# Patient Record
Sex: Female | Born: 2000 | Race: Black or African American | Hispanic: No | Marital: Single | Smoking: Never smoker
Health system: Southern US, Community
[De-identification: ages and names within clinical notes are randomized; demographics above are authoritative.]

---
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
## 2020-07-31 ENCOUNTER — Emergency Department (HOSPITAL_COMMUNITY): Payer: Self-pay

## 2020-07-31 ENCOUNTER — Emergency Department (HOSPITAL_COMMUNITY)
Admission: EM | Admit: 2020-07-31 | Discharge: 2020-07-31 | Disposition: A | Discharge status: Home / Self Care | Payer: Self-pay | Attending: Emergency Medicine | Admitting: Emergency Medicine

## 2020-07-31 ENCOUNTER — Other Ambulatory Visit: Payer: Self-pay

## 2020-07-31 DIAGNOSIS — K8020 Calculus of gallbladder without cholecystitis without obstruction: Secondary | ICD-10-CM | POA: Insufficient documentation

## 2020-07-31 DIAGNOSIS — R109 Unspecified abdominal pain: Secondary | ICD-10-CM

## 2020-07-31 DIAGNOSIS — Z7722 Contact with and (suspected) exposure to environmental tobacco smoke (acute) (chronic): Secondary | ICD-10-CM | POA: Insufficient documentation

## 2020-07-31 LAB — URINALYSIS, ROUTINE W REFLEX MICROSCOPIC
Bilirubin Urine: NEGATIVE
Glucose, UA: NEGATIVE mg/dL
Hgb urine dipstick: NEGATIVE
Ketones, ur: NEGATIVE mg/dL
Nitrite: NEGATIVE
Protein, ur: NEGATIVE mg/dL
Specific Gravity, Urine: 1.021 (ref 1.005–1.030)
pH: 7 (ref 5.0–8.0)

## 2020-07-31 LAB — I-STAT BETA HCG BLOOD, ED (MC, WL, AP ONLY): I-stat hCG, quantitative: <5 m[IU]/mL (ref <5)

## 2020-07-31 LAB — COMPREHENSIVE METABOLIC PANEL
ALT: 24 U/L (ref 0–44)
AST: 29 U/L (ref 15–41)
Albumin: 4 g/dL (ref 3.5–5.0)
Alkaline Phosphatase: 95 U/L (ref 38–126)
Anion gap: 9 (ref 5–15)
BUN: 5 mg/dL — ABNORMAL LOW (ref 6–20)
CO2: 24 mmol/L (ref 22–32)
Calcium: 9.5 mg/dL (ref 8.9–10.3)
Chloride: 103 mmol/L (ref 98–111)
Creatinine, Ser: 0.73 mg/dL (ref 0.44–1.00)
GFR calc Af Amer: >60 mL/min (ref >60)
GFR calc non Af Amer: >60 mL/min (ref >60)
Glucose, Bld: 100 mg/dL — ABNORMAL HIGH (ref 70–99)
Potassium: 4.3 mmol/L (ref 3.5–5.1)
Sodium: 136 mmol/L (ref 135–145)
Total Bilirubin: 0.9 mg/dL (ref 0.3–1.2)
Total Protein: 6.8 g/dL (ref 6.5–8.1)

## 2020-07-31 LAB — LIPASE, BLOOD: Lipase: 25 U/L (ref 11–51)

## 2020-07-31 MED ORDER — HYDROCODONE-ACETAMINOPHEN 5-325 MG PO TABS: 1.0000 | ORAL_TABLET | Freq: Four times a day (QID) | ORAL | 0 refills | Status: DC | PRN

## 2020-07-31 NOTE — ED Notes (Signed)
D/C paperwork reviewed w/ pt as well as follow-up care and pain management. Pt verbalized understanding. VSS, pt A&Ox4 and ambulatory w/ steady gait upon departure.

## 2020-07-31 NOTE — ED Notes (Signed)
Pt in no apparent distress at this time. Pt playing on phone and laughing w/ support person at this time.

## 2020-07-31 NOTE — Discharge Instructions (Addendum)
Work-up here today gallstone.  Which may be causing your symptoms.  No evidence of any kidney stones.  The labs that they were able to get showed no complicating factors related to the gallstone.  Your liver function tests were normal.  Recommend make an appointment to the wellness clinic for follow-up.  As well as calling [facility] regarding the gallstone.  They may very well want to get an ultrasound.  Or the wellness clinic can order the ultrasound to help confirm the gallstone.  Return for worse pain fevers persistent vomiting or any severe pain lasting 3 hours or longer.  This can be signs of a complication from the gallstone.  Would recommend low-fat diet this helps prevent further symptoms from the gallstone.

## 2020-07-31 NOTE — ED Triage Notes (Signed)
Patient complains of right sided flank pain that started approximately one and a half weeks ago, describe pain as feeling like a cramp. Denies changes in urination, denies any other complaints. Patient alert, oriented, and ambulatory.

## 2020-07-31 NOTE — ED Notes (Signed)
Attempted 2 IV sticks to place IV and obtain blood; both attempts unsuccessful.

## 2020-07-31 NOTE — ED Notes (Addendum)
Attempted IV x1, pt informed this RN that she did not want to be stuck again. Zackowski MD aware.

## 2020-07-31 NOTE — ED Provider Notes (Signed)
[facility] EMERGENCY DEPARTMENT Provider Note   CSN: [id number] Arrival date & time: 07/31/20  1237     History Chief Complaint  Patient presents with  . Flank Pain    Angelica Myers is a 19 y.o. female.  Patient with a complaint of right flank pain for about a week.  It waxes and wanes but has been there constantly.  Not associated with any vomiting not associated with any hematuria or urinary symptoms.  Patient states it kind of feels like a cramp when it gets intense.  Currently it is very minimal.  Pain is more on the upper part of the right flank.  Never had anything like this before.        No past medical history on file.  There are no problems to display for this patient.   No past surgical history on file.   OB History   No obstetric history on file.     No family history on file.  Social History   Tobacco Use  . Smoking status: Passive Smoke Exposure - Never Smoker  Substance Use Topics  . Alcohol use: No  . Drug use: Not on file    Home Medications Prior to Admission medications   Medication Sig Start Date End Date Taking? Authorizing Provider  albuterol (PROVENTIL HFA;VENTOLIN HFA) 108 (90 Base) MCG/ACT inhaler Inhale 2 puffs into the lungs every 6 (six) hours as needed. Patient not taking: Reported on 07/31/2020 04/02/16   Rebecka Apley, MD    Allergies    Patient has no known allergies.  Review of Systems   Review of Systems  Constitutional: Negative for chills and fever.  HENT: Negative for congestion, rhinorrhea and sore throat.   Eyes: Negative for visual disturbance.  Respiratory: Negative for cough and shortness of breath.   Cardiovascular: Negative for chest pain and leg swelling.  Gastrointestinal: Positive for abdominal pain. Negative for diarrhea, nausea and vomiting.  Genitourinary: Positive for flank pain. Negative for dysuria and hematuria.  Musculoskeletal: Negative for back pain and neck pain.   Skin: Negative for rash.  Neurological: Negative for dizziness, light-headedness and headaches.  Hematological: Does not bruise/bleed easily.  Psychiatric/Behavioral: Negative for confusion.    Physical Exam Updated Vital Signs BP 105/67 (BP Location: Right Arm)   Pulse 70   Temp 98.5 F (36.9 C) (Oral)   Resp 16   Ht 1.575 m (5\' 2" )   Wt 86.2 kg   LMP 07/13/2020 (Approximate)   SpO2 100%   BMI 34.75 kg/m   Physical Exam Vitals and nursing note reviewed.  Constitutional:      General: She is not in acute distress.    Appearance: Normal appearance. She is well-developed.  HENT:     Head: Normocephalic and atraumatic.  Eyes:     Extraocular Movements: Extraocular movements intact.     Conjunctiva/sclera: Conjunctivae normal.     Pupils: Pupils are equal, round, and reactive to light.  Cardiovascular:     Rate and Rhythm: Normal rate and regular rhythm.     Heart sounds: No murmur heard.   Pulmonary:     Effort: Pulmonary effort is normal. No respiratory distress.     Breath sounds: Normal breath sounds.  Abdominal:     Palpations: Abdomen is soft.     Tenderness: There is abdominal tenderness. There is no guarding.     Comments: Some  mild tenderness right upper quadrant no guarding  Musculoskeletal:  General: Normal range of motion.     Cervical back: Normal range of motion and neck supple.  Skin:    General: Skin is warm and dry.     Capillary Refill: Capillary refill takes less than 2 seconds.  Neurological:     General: No focal deficit present.     Mental Status: She is alert and oriented to person, place, and time.     Cranial Nerves: No cranial nerve deficit.     Sensory: No sensory deficit.     Motor: No weakness.     ED Results / Procedures / Treatments   Labs (all labs ordered are listed, but only abnormal results are displayed) Labs Reviewed  URINALYSIS, ROUTINE W REFLEX MICROSCOPIC - Abnormal; Notable for the following components:       Result Value   APPearance HAZY (*)    Leukocytes,Ua MODERATE (*)    Bacteria, UA RARE (*)    All other components within normal limits  COMPREHENSIVE METABOLIC PANEL - Abnormal; Notable for the following components:   Glucose, Bld 100 (*)    BUN 5 (*)    All other components within normal limits  LIPASE, BLOOD  CBC WITH DIFFERENTIAL/PLATELET  I-STAT BETA HCG BLOOD, ED ([facility], [facility], [facility] ONLY)    EKG None  Radiology CT Renal Stone Study  Result Date: 07/31/2020 CLINICAL DATA:  Right-sided flank pain EXAM: CT ABDOMEN AND PELVIS WITHOUT CONTRAST TECHNIQUE: Multidetector CT imaging of the abdomen and pelvis was performed following the standard protocol without IV contrast. COMPARISON:  None. FINDINGS: Lower chest: No acute abnormality. Hepatobiliary: Subcentimeter hypodensity within the anterior liver too small to further characterize. No biliary dilatation. Relative hypodensity in the fundus and neck of the gallbladder. No inflammatory changes at the right upper quadrant. Pancreas: Unremarkable. No pancreatic ductal dilatation or surrounding inflammatory changes. Spleen: Normal in size without focal abnormality. Adrenals/Urinary Tract: Adrenal glands are unremarkable. Kidneys are normal, without renal calculi, focal lesion, or hydronephrosis. Bladder is unremarkable. Stomach/Bowel: Stomach is within normal limits. Appendix appears normal. No evidence of bowel wall thickening, distention, or inflammatory changes. Vascular/Lymphatic: No significant vascular findings are present. No enlarged abdominal or pelvic lymph nodes. Reproductive: Uterus is unremarkable. 4.1 cm benign-appearing right adnexal cyst, no further imaging is required. Other: Negative for free air or free fluid. Musculoskeletal: No acute or significant osseous findings. IMPRESSION: 1. Negative for nephrolithiasis, hydronephrosis, or ureteral stone. 2. Possible noncalcified stones within the gallbladder. Correlation with ultrasound may be  obtained as clinically indicated. 3. 4.1 cm benign-appearing right adnexal cyst, no further imaging is required unless torsion is suspected clinically. Electronically Signed   By: Jasmine Pang M.D.   On: 07/31/2020 20:28    Procedures Procedures (including critical care time)  Medications Ordered in ED Medications - No data to display  ED Course  I have reviewed the triage vital signs and the nursing notes.  Pertinent labs & imaging results that were available during my care of the patient were reviewed by me and considered in my medical decision making (see chart for details).    MDM Rules/Calculators/A&P                           Patient work-up was significant for CT scan that showed no kidney stones would raise concerns about a gallbladder stone.  Patient CBC was hemolyzed but we were able to get a complete metabolic panel and lipase and all the liver function stuff was  normal.  Pregnancy test negative urinalysis not consistent with urinary tract infection may be some contamination.  Urine will be sent for culture just in case.  Patient will be given referral to wellness clinic since she has no primary care doctor.  Patient will be given a prescription for some pain medicine.  Patient also given a referral to general surgery.  Will need outpatient ultrasound either done by wellness clinic or perhaps to the general surgery clinic.  Patient without significant tenderness in the right upper quadrant.  And liver function test are normal no evidence of acute cholecystitis.       Final Clinical Impression(s) / ED Diagnoses Final diagnoses:  Right flank pain  Calculus of gallbladder without cholecystitis without obstruction    Rx / DC Orders ED Discharge Orders    None       Vanetta Mulders, MD 07/31/20 2125

## 2020-08-07 ENCOUNTER — Observation Stay
Admission: EM | Admit: 2020-08-07 | Discharge: 2020-08-08 | Disposition: A | Discharge status: Home / Self Care | Payer: Self-pay | Attending: Surgery | Admitting: Surgery

## 2020-08-07 ENCOUNTER — Encounter (HOSPITAL_COMMUNITY): Payer: Self-pay

## 2020-08-07 ENCOUNTER — Other Ambulatory Visit: Payer: Self-pay

## 2020-08-07 ENCOUNTER — Emergency Department: Payer: Self-pay

## 2020-08-07 ENCOUNTER — Emergency Department (HOSPITAL_COMMUNITY)
Admission: EM | Admit: 2020-08-07 | Discharge: 2020-08-07 | Disposition: A | Discharge status: Home / Self Care | Payer: Self-pay | Attending: Emergency Medicine | Admitting: Emergency Medicine

## 2020-08-07 DIAGNOSIS — K8020 Calculus of gallbladder without cholecystitis without obstruction: Secondary | ICD-10-CM

## 2020-08-07 DIAGNOSIS — R1013 Epigastric pain: Secondary | ICD-10-CM | POA: Insufficient documentation

## 2020-08-07 DIAGNOSIS — R112 Nausea with vomiting, unspecified: Secondary | ICD-10-CM | POA: Insufficient documentation

## 2020-08-07 DIAGNOSIS — Z20822 Contact with and (suspected) exposure to covid-19: Secondary | ICD-10-CM | POA: Insufficient documentation

## 2020-08-07 DIAGNOSIS — Z5321 Procedure and treatment not carried out due to patient leaving prior to being seen by health care provider: Secondary | ICD-10-CM | POA: Insufficient documentation

## 2020-08-07 DIAGNOSIS — K8000 Calculus of gallbladder with acute cholecystitis without obstruction: Principal | ICD-10-CM | POA: Insufficient documentation

## 2020-08-07 DIAGNOSIS — K819 Cholecystitis, unspecified: Secondary | ICD-10-CM | POA: Diagnosis present

## 2020-08-07 DIAGNOSIS — Z7722 Contact with and (suspected) exposure to environmental tobacco smoke (acute) (chronic): Secondary | ICD-10-CM | POA: Insufficient documentation

## 2020-08-07 DIAGNOSIS — R109 Unspecified abdominal pain: Secondary | ICD-10-CM

## 2020-08-07 LAB — COMPREHENSIVE METABOLIC PANEL
ALT: 19 U/L (ref 0–44)
ALT: 20 U/L (ref 0–44)
AST: 20 U/L (ref 15–41)
AST: 23 U/L (ref 15–41)
Albumin: 3.4 g/dL — ABNORMAL LOW (ref 3.5–5.0)
Albumin: 4 g/dL (ref 3.5–5.0)
Alkaline Phosphatase: 86 U/L (ref 38–126)
Alkaline Phosphatase: 85 U/L (ref 38–126)
Anion gap: 8 (ref 5–15)
Anion gap: 10 (ref 5–15)
BUN: 7 mg/dL (ref 6–20)
BUN: 6 mg/dL (ref 6–20)
CO2: 24 mmol/L (ref 22–32)
CO2: 25 mmol/L (ref 22–32)
Calcium: 9 mg/dL (ref 8.9–10.3)
Calcium: 9.1 mg/dL (ref 8.9–10.3)
Chloride: 104 mmol/L (ref 98–111)
Chloride: 104 mmol/L (ref 98–111)
Creatinine, Ser: 0.67 mg/dL (ref 0.44–1.00)
Creatinine, Ser: 0.51 mg/dL (ref 0.44–1.00)
GFR calc Af Amer: >60 mL/min (ref >60)
GFR calc Af Amer: >60 mL/min (ref >60)
GFR calc non Af Amer: >60 mL/min (ref >60)
GFR calc non Af Amer: >60 mL/min (ref >60)
Glucose, Bld: 104 mg/dL — ABNORMAL HIGH (ref 70–99)
Glucose, Bld: 91 mg/dL (ref 70–99)
Potassium: 3.7 mmol/L (ref 3.5–5.1)
Potassium: 3.5 mmol/L (ref 3.5–5.1)
Sodium: 136 mmol/L (ref 135–145)
Sodium: 139 mmol/L (ref 135–145)
Total Bilirubin: 0.6 mg/dL (ref 0.3–1.2)
Total Bilirubin: 0.9 mg/dL (ref 0.3–1.2)
Total Protein: 6.1 g/dL — ABNORMAL LOW (ref 6.5–8.1)
Total Protein: 7.2 g/dL (ref 6.5–8.1)

## 2020-08-07 LAB — CBC
HCT: 38 % (ref 36.0–46.0)
Hemoglobin: 12.6 g/dL (ref 12.0–15.0)
MCH: 31.4 pg (ref 26.0–34.0)
MCHC: 33.2 g/dL (ref 30.0–36.0)
MCV: 94.8 fL (ref 80.0–100.0)
Platelets: 266 10*3/uL (ref 150–400)
RBC: 4.01 MIL/uL (ref 3.87–5.11)
RDW: 11.9 % (ref 11.5–15.5)
WBC: 5.6 10*3/uL (ref 4.0–10.5)
nRBC: 0 % (ref 0.0–0.2)

## 2020-08-07 LAB — CBC WITH DIFFERENTIAL/PLATELET
Abs Immature Granulocytes: 0.02 10*3/uL (ref 0.00–0.07)
Basophils Absolute: 0 10*3/uL (ref 0.0–0.1)
Basophils Relative: 0 %
Eosinophils Absolute: 0 10*3/uL (ref 0.0–0.5)
Eosinophils Relative: 0 %
HCT: 39 % (ref 36.0–46.0)
Hemoglobin: 13.7 g/dL (ref 12.0–15.0)
Immature Granulocytes: 0 %
Lymphocytes Relative: 14 %
Lymphs Abs: 1.1 10*3/uL (ref 0.7–4.0)
MCH: 32.4 pg (ref 26.0–34.0)
MCHC: 35.1 g/dL (ref 30.0–36.0)
MCV: 92.2 fL (ref 80.0–100.0)
Monocytes Absolute: 0.4 10*3/uL (ref 0.1–1.0)
Monocytes Relative: 5 %
Neutro Abs: 6.5 10*3/uL (ref 1.7–7.7)
Neutrophils Relative %: 81 %
Platelets: 270 10*3/uL (ref 150–400)
RBC: 4.23 MIL/uL (ref 3.87–5.11)
RDW: 11.9 % (ref 11.5–15.5)
WBC: 8.1 10*3/uL (ref 4.0–10.5)
nRBC: 0 % (ref 0.0–0.2)

## 2020-08-07 LAB — TYPE AND SCREEN
ABO/RH(D): O NEG
Antibody Screen: NEGATIVE

## 2020-08-07 LAB — LIPASE, BLOOD
Lipase: 24 U/L (ref 11–51)
Lipase: 27 U/L (ref 11–51)

## 2020-08-07 LAB — I-STAT BETA HCG BLOOD, ED (MC, WL, AP ONLY): I-stat hCG, quantitative: <5 m[IU]/mL (ref <5)

## 2020-08-07 LAB — SARS CORONAVIRUS 2 BY RT PCR (HOSPITAL ORDER, PERFORMED IN ~~LOC~~ HOSPITAL LAB): SARS Coronavirus 2: NEGATIVE

## 2020-08-07 LAB — PROTIME-INR
INR: 1 (ref 0.8–1.2)
Prothrombin Time: 13.2 seconds (ref 11.4–15.2)

## 2020-08-07 MED ORDER — ONDANSETRON 4 MG PO TBDP: 4.0000 mg | ORAL_TABLET | Freq: Four times a day (QID) | ORAL | Status: DC | PRN

## 2020-08-07 MED ORDER — OXYCODONE HCL 5 MG PO TABS
5.0000 mg | ORAL_TABLET | ORAL | Status: DC | PRN
  Administered 2020-08-08: 5 mg via ORAL
  Filled 2020-08-07: qty 1

## 2020-08-07 MED ORDER — ACETAMINOPHEN 500 MG PO TABS
1000.0000 mg | ORAL_TABLET | Freq: Four times a day (QID) | ORAL | Status: DC
  Administered 2020-08-07 – 2020-08-08 (×2): 1000 mg via ORAL
  Filled 2020-08-07 (×2): qty 2

## 2020-08-07 MED ORDER — PANTOPRAZOLE SODIUM 40 MG IV SOLR
40.0000 mg | Freq: Every day | INTRAVENOUS | Status: DC
  Administered 2020-08-07: 40 mg via INTRAVENOUS
  Filled 2020-08-07: qty 40

## 2020-08-07 MED ORDER — KETOROLAC TROMETHAMINE 30 MG/ML IJ SOLN: 30.0000 mg | Freq: Four times a day (QID) | INTRAMUSCULAR | Status: DC | PRN

## 2020-08-07 MED ORDER — MORPHINE SULFATE (PF) 2 MG/ML IV SOLN
2.0000 mg | INTRAVENOUS | Status: DC | PRN
  Administered 2020-08-08: 2 mg via INTRAVENOUS
  Filled 2020-08-07: qty 1

## 2020-08-07 MED ORDER — ONDANSETRON 4 MG PO TBDP
8.0000 mg | ORAL_TABLET | Freq: Once | ORAL | Status: AC
  Administered 2020-08-07: 8 mg via ORAL
  Filled 2020-08-07: qty 2

## 2020-08-07 MED ORDER — SODIUM CHLORIDE 0.9 % IV BOLUS
1000.0000 mL | Freq: Once | INTRAVENOUS | Status: AC
  Administered 2020-08-07: 1000 mL via INTRAVENOUS

## 2020-08-07 MED ORDER — DIPHENHYDRAMINE HCL 12.5 MG/5ML PO ELIX
12.5000 mg | ORAL_SOLUTION | Freq: Four times a day (QID) | ORAL | Status: DC | PRN
  Filled 2020-08-07: qty 5

## 2020-08-07 MED ORDER — HEPARIN SODIUM (PORCINE) 5000 UNIT/ML IJ SOLN
5000.0000 [IU] | Freq: Three times a day (TID) | INTRAMUSCULAR | Status: DC
  Filled 2020-08-07: qty 1

## 2020-08-07 MED ORDER — DIPHENHYDRAMINE HCL 50 MG/ML IJ SOLN: 12.5000 mg | Freq: Four times a day (QID) | INTRAMUSCULAR | Status: DC | PRN

## 2020-08-07 MED ORDER — SODIUM CHLORIDE 0.9 % IV SOLN
2.0000 g | INTRAVENOUS | Status: DC
  Administered 2020-08-07: 2 g via INTRAVENOUS
  Filled 2020-08-07 (×3): qty 20

## 2020-08-07 MED ORDER — ONDANSETRON HCL 4 MG/2ML IJ SOLN
4.0000 mg | Freq: Four times a day (QID) | INTRAMUSCULAR | Status: DC | PRN
  Administered 2020-08-08: 4 mg via INTRAVENOUS

## 2020-08-07 MED ORDER — SODIUM CHLORIDE 0.9 % IV SOLN: INTRAVENOUS | Status: DC

## 2020-08-07 MED ORDER — LACTATED RINGERS IV SOLN: INTRAVENOUS | Status: DC

## 2020-08-07 MED ORDER — KETOROLAC TROMETHAMINE 10 MG PO TABS
10.0000 mg | ORAL_TABLET | Freq: Once | ORAL | Status: AC
  Administered 2020-08-07: 10 mg via ORAL
  Filled 2020-08-07: qty 1

## 2020-08-07 MED ORDER — HYDROMORPHONE HCL 1 MG/ML IJ SOLN
0.5000 mg | Freq: Once | INTRAMUSCULAR | Status: AC
  Administered 2020-08-07: 0.5 mg via INTRAVENOUS
  Filled 2020-08-07: qty 1

## 2020-08-07 NOTE — ED Notes (Signed)
Pt called x3 for vital recheck no answer 

## 2020-08-07 NOTE — ED Notes (Signed)
Spoke with marsha on floor, continue to await bed arrival.

## 2020-08-07 NOTE — ED Notes (Signed)
No labs needed at this time. Drawn this am at [facility].

## 2020-08-07 NOTE — ED Triage Notes (Signed)
Patient complains of recurrent epigastric pain with nausea and vomiting since being seen last week and diagnosed with gallstones. Pain started again after eating steak last night

## 2020-08-07 NOTE — ED Notes (Signed)
Waiting on hospital bed to be available to transport pt to floor, currently per floor no bed in admission room and awaiting bed delivery from [facility].

## 2020-08-07 NOTE — ED Notes (Signed)
Pt had PIV in L AC, painful when flushing, pt been stuck multiple times between [facility] and [facility] ED, pt having surgery tomorrow.

## 2020-08-07 NOTE — ED Notes (Signed)
See triage note, pt reports recently being discharged from hospital with dx with gallstones, prescription for pain meds and referral to general surgery, has appt with general surgery in near future.  Reports vomiting that started today, unknown amount, denies fevers, increased pain to RUQ.  Was seen at [facility] today and LWBS, labs drawn.  Ambulatory to treatment room, alert and oriented, clear speech.

## 2020-08-07 NOTE — Progress Notes (Signed)
D/w Dr. Scotty Court in detail. Pt impacted stone clinically c/w acute cholecystitis,  no evidence of biliary obstruction. We will admit, IV A/Bs, add her to the OR schedule tomorrow .

## 2020-08-07 NOTE — ED Provider Notes (Addendum)
[facility] Emergency Department Provider Note  ____________________________________________  Time seen: Approximately 8:03 PM  I have reviewed the triage vital signs and the nursing notes.   HISTORY  Chief Complaint Flank Pain    HPI Angelica Myers is a 19 y.o. female with known cholelithiasis who comes to the ED today complaining of persistent right upper quadrant abdominal pain radiating to the back and vomiting. Pain is severe, aching. No aggravating or alleviating factors. She has not tried to eat or drink anything today due to the vomiting. Denies diarrhea or constipation, no fevers chills chest pain or shortness of breath.  Initially had labs done at [facility], but left without being seen from the ED and then came to the [facility] regional later when symptoms persisted.         There are no problems to display for this patient.    History reviewed. No pertinent surgical history.   Prior to Admission medications   Medication Sig Start Date End Date Taking? Authorizing Provider  albuterol (PROVENTIL HFA;VENTOLIN HFA) 108 (90 Base) MCG/ACT inhaler Inhale 2 puffs into the lungs every 6 (six) hours as needed. Patient not taking: Reported on 07/31/2020 04/02/16   Rebecka Apley, MD  HYDROcodone-acetaminophen (NORCO/VICODIN) 5-325 MG tablet Take 1 tablet by mouth every 6 (six) hours as needed for moderate pain. 07/31/20   Vanetta Mulders, MD     Allergies Patient has no known allergies.   History reviewed. No pertinent family history.  Social History Social History   Tobacco Use  . Smoking status: Passive Smoke Exposure - Never Smoker  Substance Use Topics  . Alcohol use: No  . Drug use: Not on file    Review of Systems  Constitutional:   No fever or chills.  ENT:   No sore throat. No rhinorrhea. Cardiovascular:   No chest pain or syncope. Respiratory:   No dyspnea or cough. Gastrointestinal: Positive for abdominal pain and  vomiting Musculoskeletal:   Negative for focal pain or swelling All other systems reviewed and are negative except as documented above in ROS and HPI.  ____________________________________________   PHYSICAL EXAM:  VITAL SIGNS: ED Triage Vitals  Enc Vitals Group     BP 08/07/20 1233 118/78     Pulse Rate 08/07/20 1232 79     Resp 08/07/20 1232 18     Temp 08/07/20 1232 98.4 F (36.9 C)     Temp Source 08/07/20 1232 Oral     SpO2 08/07/20 1232 97 %     Weight 08/07/20 1232 198 lb 6.6 oz (90 kg)     Height 08/07/20 1232 5\' 2"  (1.575 m)     Head Circumference --      Peak Flow --      Pain Score 08/07/20 1232 8     Pain Loc --      Pain Edu? --      Excl. in GC? --     Vital signs reviewed, nursing assessments reviewed.   Constitutional:   Alert and oriented. Non-toxic appearance. Eyes:   Conjunctivae are normal. EOMI. PERRL. ENT      Head:   Normocephalic and atraumatic.      Nose:   Wearing a mask.      Mouth/Throat:   Wearing a mask.      Neck:   No meningismus. Full ROM. Hematological/Lymphatic/Immunilogical:   No cervical lymphadenopathy. Cardiovascular:   RRR. Symmetric bilateral radial and DP pulses.  No murmurs. Cap refill less than  2 seconds. Respiratory:   Normal respiratory effort without tachypnea/retractions. Breath sounds are clear and equal bilaterally. No wheezes/rales/rhonchi. Gastrointestinal:   Soft with right upper quadrant tenderness and positive Murphy sign. Non distended. There is no CVA tenderness.  No rebound, rigidity, or guarding.  Musculoskeletal:   Normal range of motion in all extremities. No joint effusions.  No lower extremity tenderness.  No edema. Neurologic:   Normal speech and language.  Motor grossly intact. No acute focal neurologic deficits are appreciated.  Skin:    Skin is warm, dry and intact. No rash noted.  No petechiae, purpura, or bullae.  ____________________________________________    LABS (pertinent  positives/negatives) (all labs ordered are listed, but only abnormal results are displayed) Labs Reviewed  SARS CORONAVIRUS 2 BY RT PCR (HOSPITAL ORDER, PERFORMED IN [facility])  COMPREHENSIVE METABOLIC PANEL  LIPASE, BLOOD  CBC WITH DIFFERENTIAL/PLATELET  PROTIME-INR  TYPE AND SCREEN   ____________________________________________   EKG    ____________________________________________    RADIOLOGY  US ABDOMEN LIMITED RUQ  Result Date: 08/07/2020 CLINICAL DATA:  Right upper quadrant abdominal pain EXAM: ULTRASOUND ABDOMEN LIMITED RIGHT UPPER QUADRANT COMPARISON:  CT 07/31/2020 FINDINGS: Gallbladder: Hypodensities noted within the gallbladder lumen on a prior CT examination correspond to 2 shadowing gallstones of the current examination. One stone appears impacted within the gallbladder neck. However, the gallbladder is not distended, there is no significant gallbladder wall thickening, and no pericholecystic fluid is identified. The sonographic Eulah Pont sign is reportedly negative. Common bile duct: Diameter: 3 mm in proximal diameter Liver: No focal lesion identified. Within normal limits in parenchymal echogenicity. Portal vein is patent on color Doppler imaging with normal direction of blood flow towards the liver. Other: No ascites IMPRESSION: Cholelithiasis with stone impacted within the gallbladder neck. No superimposed sonographic features of acute cholecystitis. Electronically Signed   By: Helyn Numbers MD   On: 08/07/2020 20:00    ____________________________________________   PROCEDURES Angiocath insertion  Date/Time: 08/07/2020 9:12 PM Performed by: Sharman Cheek, MD Authorized by: Sharman Cheek, MD  Preparation: Patient was prepped and draped in the usual sterile fashion. Local anesthesia used: no  Anesthesia: Local anesthesia used: no  Sedation: Patient sedated: no  Patient tolerance: patient tolerated the procedure well with no immediate  complications Comments: [place] vis, 1 attempt, EBL 0. 20gauge R upper arm     ____________________________________________  DIFFERENTIAL DIAGNOSIS   Cholecystitis, choledocholithiasis, impacted gallstone, biliary colic, constipation  CLINICAL IMPRESSION / ASSESSMENT AND PLAN / ED COURSE  Medications ordered in the ED: Medications  lactated ringers infusion (has no administration in time range)  HYDROmorphone (DILAUDID) injection 0.5 mg (has no administration in time range)  ondansetron (ZOFRAN-ODT) disintegrating tablet 8 mg (8 mg Oral Given 08/07/20 1914)  ketorolac (TORADOL) tablet 10 mg (10 mg Oral Given 08/07/20 1913)    Pertinent labs & imaging results that were available during my care of the patient were reviewed by me and considered in my medical decision making (see chart for details).  Angelica Myers was evaluated in Emergency Department on 08/07/2020 for the symptoms described in the history of present illness. She was evaluated in the context of the global COVID-19 pandemic, which necessitated consideration that the patient might be at risk for infection with the SARS-CoV-2 virus that causes COVID-19. Institutional protocols and algorithms that pertain to the evaluation of patients at risk for COVID-19 are in a state of rapid change based on information released by regulatory bodies including the CDC and federal  and state organizations. These policies and algorithms were followed during the patient's care in the ED.   Patient presents with persistent abdominal pain and vomiting throughout the day. Vital signs are normal, labs from [facility] earlier today reviewed which are unremarkable. Patient given oral Toradol and Zofran while obtaining an ultrasound which shows unfortunately an impacted 1.2 cm gallstone in the gallbladder neck. With this finding and her persistent symptoms, I contacted Dr. Everlene Farrier who will plan cholecystectomy tomorrow. Patient is agreeable to this.  We will  send a new set of labs, Covid screening, give IV Dilaudid 0.5 mg for additional pain relief. IV fluid at maintenance rate. N.p.o. after midnight. Clears in the meantime.      ____________________________________________   FINAL CLINICAL IMPRESSION(S) / ED DIAGNOSES    Final diagnoses:  Impacted gallstone of gallbladder  Intractable abdominal pain     ED Discharge Orders    None      Portions of this note were generated with dragon dictation software. Dictation errors may occur despite best attempts at proofreading.   Sharman Cheek, MD 08/07/20 Malvin Johns    Sharman Cheek, MD 08/07/20 2113

## 2020-08-07 NOTE — ED Notes (Signed)
Last call for pt. No response.  

## 2020-08-07 NOTE — ED Notes (Signed)
Pt up to restroom to void.  

## 2020-08-07 NOTE — ED Triage Notes (Signed)
Pt states recent gallstone dx and told to come back if starting vomiting. Pt seen as [place] today but LWBS. Blood was drawn this morning.

## 2020-08-07 NOTE — ED Notes (Signed)
US at bedside

## 2020-08-08 ENCOUNTER — Encounter: Payer: Self-pay | Admitting: Surgery

## 2020-08-08 ENCOUNTER — Encounter
Admission: EM | Disposition: A | Discharge status: Home / Self Care | Payer: Self-pay | Source: Home / Self Care | Attending: Emergency Medicine

## 2020-08-08 ENCOUNTER — Observation Stay: Payer: Self-pay | Admitting: Anesthesiology

## 2020-08-08 DIAGNOSIS — K819 Cholecystitis, unspecified: Secondary | ICD-10-CM

## 2020-08-08 LAB — HIV ANTIBODY (ROUTINE TESTING W REFLEX): HIV Screen 4th Generation wRfx: NONREACTIVE

## 2020-08-08 SURGERY — CHOLECYSTECTOMY, ROBOT-ASSISTED, LAPAROSCOPIC
Anesthesia: General

## 2020-08-08 MED ORDER — SODIUM CHLORIDE 0.9 % IV SOLN
2.0000 g | Freq: Once | INTRAVENOUS | Status: AC
  Administered 2020-08-08: 2 g via INTRAVENOUS
  Filled 2020-08-08: qty 20

## 2020-08-08 MED ORDER — LIDOCAINE HCL (CARDIAC) PF 100 MG/5ML IV SOSY
PREFILLED_SYRINGE | INTRAVENOUS | Status: DC | PRN
  Administered 2020-08-08: 80 mg via INTRAVENOUS

## 2020-08-08 MED ORDER — HYDROCODONE-ACETAMINOPHEN 5-325 MG PO TABS: 1.0000 | ORAL_TABLET | ORAL | 0 refills | Status: DC | PRN

## 2020-08-08 MED ORDER — PROPOFOL 10 MG/ML IV BOLUS
INTRAVENOUS | Status: AC
  Filled 2020-08-08: qty 20

## 2020-08-08 MED ORDER — PHENYLEPHRINE HCL (PRESSORS) 10 MG/ML IV SOLN
INTRAVENOUS | Status: DC | PRN
  Administered 2020-08-08: 100 ug via INTRAVENOUS
  Administered 2020-08-08: 200 ug via INTRAVENOUS
  Administered 2020-08-08: 100 ug via INTRAVENOUS

## 2020-08-08 MED ORDER — ROCURONIUM BROMIDE 100 MG/10ML IV SOLN
INTRAVENOUS | Status: DC | PRN
  Administered 2020-08-08: 10 mg via INTRAVENOUS
  Administered 2020-08-08: 50 mg via INTRAVENOUS

## 2020-08-08 MED ORDER — ACETAMINOPHEN 10 MG/ML IV SOLN
INTRAVENOUS | Status: AC
  Filled 2020-08-08: qty 100

## 2020-08-08 MED ORDER — SCOPOLAMINE 1 MG/3DAYS TD PT72
1.0000 | MEDICATED_PATCH | TRANSDERMAL | Status: DC
  Administered 2020-08-08: 1.5 mg via TRANSDERMAL

## 2020-08-08 MED ORDER — KETOROLAC TROMETHAMINE 30 MG/ML IJ SOLN
30.0000 mg | Freq: Four times a day (QID) | INTRAMUSCULAR | Status: DC
  Administered 2020-08-08: 30 mg via INTRAVENOUS
  Filled 2020-08-08: qty 1

## 2020-08-08 MED ORDER — MIDAZOLAM HCL 2 MG/2ML IJ SOLN
INTRAMUSCULAR | Status: AC
  Filled 2020-08-08: qty 2

## 2020-08-08 MED ORDER — FENTANYL CITRATE (PF) 100 MCG/2ML IJ SOLN
25.0000 ug | INTRAMUSCULAR | Status: DC | PRN
  Administered 2020-08-08: 50 ug via INTRAVENOUS

## 2020-08-08 MED ORDER — PROPOFOL 10 MG/ML IV BOLUS
INTRAVENOUS | Status: DC | PRN
  Administered 2020-08-08: 180 mg via INTRAVENOUS

## 2020-08-08 MED ORDER — MIDAZOLAM HCL 2 MG/2ML IJ SOLN
INTRAMUSCULAR | Status: DC | PRN
  Administered 2020-08-08: 2 mg via INTRAVENOUS

## 2020-08-08 MED ORDER — FENTANYL CITRATE (PF) 100 MCG/2ML IJ SOLN
INTRAMUSCULAR | Status: DC
Start: 2020-08-08 — End: 2020-08-08
  Filled 2020-08-08: qty 2

## 2020-08-08 MED ORDER — DEXAMETHASONE SODIUM PHOSPHATE 10 MG/ML IJ SOLN
INTRAMUSCULAR | Status: DC | PRN
  Administered 2020-08-08: 10 mg via INTRAVENOUS

## 2020-08-08 MED ORDER — SEVOFLURANE IN SOLN
RESPIRATORY_TRACT | Status: AC
  Filled 2020-08-08: qty 250

## 2020-08-08 MED ORDER — PROMETHAZINE HCL 25 MG/ML IJ SOLN: 6.2500 mg | INTRAMUSCULAR | Status: DC | PRN

## 2020-08-08 MED ORDER — EPINEPHRINE PF 1 MG/ML IJ SOLN
INTRAMUSCULAR | Status: AC
  Filled 2020-08-08: qty 1

## 2020-08-08 MED ORDER — SUGAMMADEX SODIUM 500 MG/5ML IV SOLN
INTRAVENOUS | Status: DC | PRN
  Administered 2020-08-08: 180 mg via INTRAVENOUS

## 2020-08-08 MED ORDER — FENTANYL CITRATE (PF) 100 MCG/2ML IJ SOLN
INTRAMUSCULAR | Status: DC | PRN
  Administered 2020-08-08: 100 ug via INTRAVENOUS
  Administered 2020-08-08 (×2): 50 ug via INTRAVENOUS

## 2020-08-08 MED ORDER — FENTANYL CITRATE (PF) 100 MCG/2ML IJ SOLN
INTRAMUSCULAR | Status: AC
  Filled 2020-08-08: qty 2

## 2020-08-08 MED ORDER — LACTATED RINGERS IV SOLN: INTRAVENOUS | Status: DC | PRN

## 2020-08-08 MED ORDER — INDOCYANINE GREEN 25 MG IV SOLR
5.0000 mg | Freq: Once | INTRAVENOUS | Status: AC
  Administered 2020-08-08: 5 mg via INTRAVENOUS
  Filled 2020-08-08: qty 2

## 2020-08-08 MED ORDER — BUPIVACAINE-EPINEPHRINE (PF) 0.25% -1:200000 IJ SOLN
INTRAMUSCULAR | Status: DC | PRN
  Administered 2020-08-08: 30 mL

## 2020-08-08 MED ORDER — OXYCODONE HCL 5 MG PO TABS: 5.0000 mg | ORAL_TABLET | Freq: Once | ORAL | Status: DC | PRN

## 2020-08-08 MED ORDER — EPHEDRINE SULFATE 50 MG/ML IJ SOLN
INTRAMUSCULAR | Status: DC | PRN
  Administered 2020-08-08: 10 mg via INTRAVENOUS

## 2020-08-08 MED ORDER — OXYCODONE HCL 5 MG/5ML PO SOLN: 5.0000 mg | Freq: Once | ORAL | Status: DC | PRN

## 2020-08-08 MED ORDER — BUPIVACAINE HCL (PF) 0.25 % IJ SOLN
INTRAMUSCULAR | Status: AC
  Filled 2020-08-08: qty 30

## 2020-08-08 MED ORDER — ACETAMINOPHEN 10 MG/ML IV SOLN
INTRAVENOUS | Status: DC | PRN
  Administered 2020-08-08: 1000 mg via INTRAVENOUS

## 2020-08-08 MED ORDER — SUCCINYLCHOLINE CHLORIDE 20 MG/ML IJ SOLN
INTRAMUSCULAR | Status: DC | PRN
  Administered 2020-08-08: 100 mg via INTRAVENOUS

## 2020-08-08 MED ORDER — SCOPOLAMINE 1 MG/3DAYS TD PT72
MEDICATED_PATCH | TRANSDERMAL | Status: AC
  Filled 2020-08-08: qty 1

## 2020-08-08 SURGICAL SUPPLY — 48 items
CANISTER SUCT 1200ML W/VALVE (MISCELLANEOUS) ×4 IMPLANT
CANNULA REDUC XI 12-8 STAPL (CANNULA) ×1
CANNULA REDUC XI 12-8MM STAPL (CANNULA) ×1
CANNULA REDUCER 12-8 DVNC XI (CANNULA) ×2 IMPLANT
CHLORAPREP W/TINT 26 (MISCELLANEOUS) ×4 IMPLANT
CLIP VESOLOCK MED LG 6/CT (CLIP) ×8 IMPLANT
COVER WAND RF STERILE (DRAPES) ×4 IMPLANT
DECANTER SPIKE VIAL GLASS SM (MISCELLANEOUS) ×4 IMPLANT
DEFOGGER SCOPE WARMER CLEARIFY (MISCELLANEOUS) ×4 IMPLANT
DERMABOND ADVANCED (GAUZE/BANDAGES/DRESSINGS) ×2
DERMABOND ADVANCED .7 DNX12 (GAUZE/BANDAGES/DRESSINGS) ×2 IMPLANT
DRAPE ARM DVNC X/XI (DISPOSABLE) ×8 IMPLANT
DRAPE COLUMN DVNC XI (DISPOSABLE) ×2 IMPLANT
DRAPE DA VINCI XI ARM (DISPOSABLE) ×8
DRAPE DA VINCI XI COLUMN (DISPOSABLE) ×2
ELECT CAUTERY BLADE 6.4 (BLADE) ×4 IMPLANT
ELECT REM PT RETURN 9FT ADLT (ELECTROSURGICAL) ×4
ELECTRODE REM PT RTRN 9FT ADLT (ELECTROSURGICAL) ×2 IMPLANT
GLOVE BIO SURGEON STRL SZ7 (GLOVE) ×8 IMPLANT
GOWN STRL REUS W/ TWL LRG LVL3 (GOWN DISPOSABLE) ×8 IMPLANT
GOWN STRL REUS W/TWL LRG LVL3 (GOWN DISPOSABLE) ×8
IRRIGATION STRYKERFLOW (MISCELLANEOUS) IMPLANT
IRRIGATOR STRYKERFLOW (MISCELLANEOUS)
IV NS 1000ML (IV SOLUTION)
IV NS 1000ML BAXH (IV SOLUTION) IMPLANT
KIT PINK PAD W/HEAD ARE REST (MISCELLANEOUS) ×4
KIT PINK PAD W/HEAD ARM REST (MISCELLANEOUS) ×2 IMPLANT
LABEL OR SOLS (LABEL) ×4 IMPLANT
NEEDLE HYPO 22GX1.5 SAFETY (NEEDLE) ×4 IMPLANT
NS IRRIG 500ML POUR BTL (IV SOLUTION) ×4 IMPLANT
OBTURATOR OPTICAL STANDARD 8MM (TROCAR) ×2
OBTURATOR OPTICAL STND 8 DVNC (TROCAR) ×2
OBTURATOR OPTICALSTD 8 DVNC (TROCAR) ×2 IMPLANT
PACK LAP CHOLECYSTECTOMY (MISCELLANEOUS) ×4 IMPLANT
PENCIL ELECTRO HAND CTR (MISCELLANEOUS) ×4 IMPLANT
POUCH SPECIMEN RETRIEVAL 10MM (ENDOMECHANICALS) ×4 IMPLANT
SEAL CANN UNIV 5-8 DVNC XI (MISCELLANEOUS) ×6 IMPLANT
SEAL XI 5MM-8MM UNIVERSAL (MISCELLANEOUS) ×6
SET TUBE SMOKE EVAC HIGH FLOW (TUBING) ×4 IMPLANT
SOLUTION ELECTROLUBE (MISCELLANEOUS) ×4 IMPLANT
SPONGE LAP 18X18 RF (DISPOSABLE) ×4 IMPLANT
SPONGE LAP 4X18 RFD (DISPOSABLE) IMPLANT
STAPLER CANNULA SEAL DVNC XI (STAPLE) ×2 IMPLANT
STAPLER CANNULA SEAL XI (STAPLE) ×2
SUT MNCRL AB 4-0 PS2 18 (SUTURE) ×4 IMPLANT
SUT VICRYL 0 AB UR-6 (SUTURE) ×8 IMPLANT
TAPE TRANSPORE STRL 2 31045 (GAUZE/BANDAGES/DRESSINGS) ×4 IMPLANT
TROCAR BALLN GELPORT 12X130M (ENDOMECHANICALS) ×4 IMPLANT

## 2020-08-08 NOTE — Anesthesia Preprocedure Evaluation (Addendum)
Anesthesia Evaluation  Patient identified by MRN, date of birth, ID band Patient awake    Reviewed: Allergy & Precautions, H&P , NPO status , Patient's Chart, lab work & pertinent test results  History of Anesthesia Complications Negative for: history of anesthetic complications  Airway Mallampati: II  TM Distance: >3 FB     Dental  (+) Teeth Intact   Pulmonary neg pulmonary ROS, neg sleep apnea, neg COPD,    breath sounds clear to auscultation       Cardiovascular (-) angina(-) Past MI and (-) Cardiac Stents negative cardio ROS  (-) dysrhythmias  Rhythm:regular Rate:Normal     Neuro/Psych negative neurological ROS  negative psych ROS   GI/Hepatic negative GI ROS, Neg liver ROS,   Endo/Other  negative endocrine ROS  Renal/GU      Musculoskeletal   Abdominal   Peds  Hematology negative hematology ROS (+)   Anesthesia Other Findings History reviewed. No pertinent past medical history.  History reviewed. No pertinent surgical history.  BMI    Body Mass Index: 36.29 kg/m      Reproductive/Obstetrics negative OB ROS                            Anesthesia Physical Anesthesia Plan  ASA: I  Anesthesia Plan: General ETT   Post-op Pain Management:    Induction:   PONV Risk Score and Plan: Ondansetron, Dexamethasone, Midazolam and Treatment may vary due to age or medical condition  Airway Management Planned:   Additional Equipment:   Intra-op Plan:   Post-operative Plan:   Informed Consent: I have reviewed the patients History and Physical, chart, labs and discussed the procedure including the risks, benefits and alternatives for the proposed anesthesia with the patient or authorized representative who has indicated his/her understanding and acceptance.     Dental Advisory Given  Plan Discussed with: Anesthesiologist, CRNA and Surgeon  Anesthesia Plan Comments:          Anesthesia Quick Evaluation

## 2020-08-08 NOTE — Plan of Care (Signed)
Patient discharged to home with family.  Patient RX given and discharge instructions given.  Patient verbalized understanding.  Patient PIVx2 removed tip intact.  Patient states pain well controlled. Ambulated independently to w/c for transport to lobby.

## 2020-08-08 NOTE — Anesthesia Procedure Notes (Signed)
Procedure Name: Intubation Date/Time: 08/08/2020 9:57 AM Performed by: Nelda Marseille, CRNA Pre-anesthesia Checklist: Patient identified, Patient being monitored, Timeout performed, Emergency Drugs available and Suction available Patient Re-evaluated:Patient Re-evaluated prior to induction Oxygen Delivery Method: Circle system utilized Preoxygenation: Pre-oxygenation with 100% oxygen Induction Type: IV induction Ventilation: Mask ventilation without difficulty Laryngoscope Size: Mac, 3 and McGraph Grade View: Grade I Tube type: Oral Tube size: 7.0 mm Number of attempts: 1 Airway Equipment and Method: Stylet and Video-laryngoscopy Placement Confirmation: ETT inserted through vocal cords under direct vision,  positive ETCO2 and breath sounds checked- equal and bilateral Secured at: 21 cm Tube secured with: Tape Dental Injury: Teeth and Oropharynx as per pre-operative assessment

## 2020-08-08 NOTE — Discharge Instructions (Signed)

## 2020-08-08 NOTE — Plan of Care (Signed)
Patient received from surgery alert and oriented. Patient ambulated to restroom.  Pain controlled effectively with as needed medications. Family at bedside.

## 2020-08-08 NOTE — Transfer of Care (Signed)
Immediate Anesthesia Transfer of Care Note  Patient: Angelica Myers  Procedure(s) Performed: XI ROBOTIC ASSISTED LAPAROSCOPIC CHOLECYSTECTOMY (N/A ) INDOCYANINE GREEN FLUORESCENCE IMAGING (ICG)  Patient Location: PACU  Anesthesia Type:General  Level of Consciousness: awake, alert  and sedated  Airway & Oxygen Therapy: Patient Spontanous Breathing and Patient connected to face mask oxygen  Post-op Assessment: Report given to RN and Post -op Vital signs reviewed and stable  Post vital signs: Reviewed and stable  Last Vitals:  Vitals Value Taken Time  BP 106/58 08/08/20 1110  Temp 35.9 C 08/08/20 1110  Pulse 82 08/08/20 1114  Resp 11 08/08/20 1114  SpO2 100 % 08/08/20 1114  Vitals shown include unvalidated device data.  Last Pain:  Vitals:   08/08/20 0821  TempSrc:   PainSc: 0-No pain         Complications: No complications documented.

## 2020-08-08 NOTE — Op Note (Signed)
Robotic assisted laparoscopic Cholecystectomy  Pre-operative Diagnosis:Acute cholecystitis  Post-operative Diagnosis: same  Procedure:  Robotic assisted laparoscopic Cholecystectomy  Surgeon: Sterling Big, MD FACS  Anesthesia: Gen. with endotracheal tube  Findings: Acute Cholecystitis   Estimated Blood Loss: 10 cc       Specimens: Gallbladder           Complications: none   Procedure Details  The patient was seen again in the Holding Room. The benefits, complications, treatment options, and expected outcomes were discussed with the patient. The risks of bleeding, infection, recurrence of symptoms, failure to resolve symptoms, bile duct damage, bile duct leak, retained common bile duct stone, bowel injury, any of which could require further surgery and/or ERCP, stent, or papillotomy were reviewed with the patient. The likelihood of improving the patient's symptoms with return to their baseline status is good.  The patient and/or family concurred with the proposed plan, giving informed consent.  The patient was taken to Operating Room, identified  and the procedure verified as Laparoscopic Cholecystectomy.  A Time Out was held and the above information confirmed.  Prior to the induction of general anesthesia, antibiotic prophylaxis was administered. VTE prophylaxis was in place. General endotracheal anesthesia was then administered and tolerated well. After the induction, the abdomen was prepped with Chloraprep and draped in the sterile fashion. The patient was positioned in the supine position.  Cut down technique was used to enter the abdominal cavity and a Hasson trochar was placed after two vicryl stitches were anchored to the fascia. Pneumoperitoneum was then created with CO2 and tolerated well without any adverse changes in the patient's vital signs.  Three 8-mm ports were placed under direct vision. All skin incisions  were infiltrated with a local anesthetic agent before making the  incision and placing the trocars.   The patient was positioned  in reverse Trendelenburg, robot was brought to the surgical field and docked in the standard fashion.  We made sure all the instrumentation was kept indirect view at all times and that there were no collision between the arms. I scrubbed out and went to the console.  The gallbladder was identified, the fundus grasped and retracted cephalad. Adhesions were lysed bluntly. The infundibulum was grasped and retracted laterally, exposing the peritoneum overlying the triangle of Calot. This was then divided and exposed in a blunt fashion. An extended critical view of the cystic duct and cystic artery was obtained.  The cystic duct was clearly identified and bluntly dissected.   Artery and duct were double clipped and divided. Using ICG cholangiography we visualize the cystic duct and so no a Baron biliary ductal anatomy or evidence of bile injuries. The gallbladder was taken from the gallbladder fossa in a retrograde fashion with the electrocautery.  Hemostasis was achieved with the electrocautery. nspection of the right upper quadrant was performed. No bleeding, bile duct injury or leak, or bowel injury was noted. Robotic instruments and robotic arms were undocked in the standard fashion.  I scrubbed back in.  The gallbladder was removed and placed in an Endocatch bag.   Pneumoperitoneum was released.  The periumbilical port site was closed with interrumpted 0 Vicryl sutures. 4-0 subcuticular Monocryl was used to close the skin. Dermabond was  applied.  The patient was then extubated and brought to the recovery room in stable condition. Sponge, lap, and needle counts were correct at closure and at the conclusion of the case.  Caroleen Hamman, MD, FACS

## 2020-08-08 NOTE — H&P (Signed)
[facility] SURGICAL HISTORY & PHYSICAL (cpt [phone])  HISTORY OF PRESENT ILLNESS (HPI):  19 y.o. female presented to [facility] ED yesterday (08/22) evening for abdominal pain. Patient reported a history of around 1 week of intermittent epigastric abdominal pain. This has been intermittent in nature however after dinner the night prior to presentation this has become more constant and severe. She describes this as a aching and crampy pain which radiates to her back. She endorses associated nausea and emesis with the pain. Nothing makes this better and eating seems to exacerbate the pain. No fever, chills, cough, CP, SOB, urinary changes, bowel changes, or juandice. She does have a known history of cholelithiasis. No previous abdominal surgeries. Work up in the ED was concerning for cholelithiasis and clinical concern for acute cholecystitis.   General surgery is consulted by emergency medicine physician Dr Sharman Cheek, MD for evaluation and management of acute cholecystitis.   PAST MEDICAL HISTORY (PMH):  History reviewed. No pertinent past medical history.  Reviewed. Otherwise negative.   PAST SURGICAL HISTORY (PSH):  History reviewed. No pertinent surgical history.  Reviewed. Otherwise negative.   MEDICATIONS:  Prior to Admission medications   Medication Sig Start Date End Date Taking? Authorizing Provider  HYDROcodone-acetaminophen (NORCO/VICODIN) 5-325 MG tablet Take 1 tablet by mouth every 6 (six) hours as needed for moderate pain. 07/31/20  Yes Vanetta Mulders, MD     ALLERGIES:  No Known Allergies   SOCIAL HISTORY:  Social History   Socioeconomic History  . Marital status: Single    Spouse name: Not on file  . Number of children: Not on file  . Years of education: Not on file  . Highest education level: Not on file  Occupational History  . Not on file  Tobacco Use  . Smoking status: Passive Smoke Exposure - Never Smoker  . Smokeless tobacco: Never Used   Vaping Use  . Vaping Use: Never used  Substance and Sexual Activity  . Alcohol use: No  . Drug use: Not on file  . Sexual activity: Not on file  Other Topics Concern  . Not on file  Social History Narrative  . Not on file   Social Determinants of Health   Financial Resource Strain:   . Difficulty of Paying Living Expenses: Not on file  Food Insecurity:   . Worried About Programme researcher, broadcasting/film/video in the Last Year: Not on file  . Ran Out of Food in the Last Year: Not on file  Transportation Needs:   . Lack of Transportation (Medical): Not on file  . Lack of Transportation (Non-Medical): Not on file  Physical Activity:   . Days of Exercise per Week: Not on file  . Minutes of Exercise per Session: Not on file  Stress:   . Feeling of Stress : Not on file  Social Connections:   . Frequency of Communication with Friends and Family: Not on file  . Frequency of Social Gatherings with Friends and Family: Not on file  . Attends Religious Services: Not on file  . Active Member of Clubs or Organizations: Not on file  . Attends Banker Meetings: Not on file  . Marital Status: Not on file  Intimate Partner Violence:   . Fear of Current or Ex-Partner: Not on file  . Emotionally Abused: Not on file  . Physically Abused: Not on file  . Sexually Abused: Not on file     FAMILY HISTORY:  History reviewed. No pertinent family history.  Otherwise negative.   REVIEW OF SYSTEMS:  Review of Systems  Constitutional: Negative for chills and fever.  HENT: Negative for congestion and sore throat.   Respiratory: Negative for cough and shortness of breath.   Cardiovascular: Negative for chest pain and palpitations.  Gastrointestinal: Positive for abdominal pain, nausea and vomiting. Negative for blood in stool, constipation and diarrhea.  Genitourinary: Negative for dysuria and urgency.  All other systems reviewed and are negative.   VITAL SIGNS:  Temp:  [98 F (36.7 C)-98.4 F  (36.9 C)] 98.3 F (36.8 C) (08/23 0027) Pulse Rate:  [65-85] 72 (08/23 0027) Resp:  [16-20] 20 (08/23 0027) BP: (107-118)/(63-93) 115/80 (08/23 0027) SpO2:  [97 %-100 %] 100 % (08/23 0027) Weight:  [90 kg-90.7 kg] 90 kg (08/22 1232)     Height: 5\' 2"  (157.5 cm) Weight: 90 kg BMI (Calculated): 36.28   PHYSICAL EXAM:  Physical Exam Vitals and nursing note reviewed. Exam conducted with a chaperone present.  Constitutional:      General: She is not in acute distress.    Appearance: Normal appearance. She is obese. She is not ill-appearing.  HENT:     Head: Normocephalic and atraumatic.  Eyes:     General: No scleral icterus.    Conjunctiva/sclera: Conjunctivae normal.     Pupils: Pupils are equal, round, and reactive to light.  Cardiovascular:     Rate and Rhythm: Normal rate and regular rhythm.     Pulses: Normal pulses.     Heart sounds: No murmur heard.   Pulmonary:     Effort: Pulmonary effort is normal. No respiratory distress.     Breath sounds: Normal breath sounds. No wheezing.  Abdominal:     General: There is no distension.     Palpations: Abdomen is soft.     Tenderness: There is abdominal tenderness in the right upper quadrant. There is no guarding or rebound. Negative signs include Murphy's sign.     Hernia: No hernia is present.  Genitourinary:    Comments: Deferred Musculoskeletal:     Right lower leg: No edema.     Left lower leg: No edema.  Skin:    General: Skin is warm and dry.     Coloration: Skin is not jaundiced or pale.     Findings: No erythema.  Neurological:     General: No focal deficit present.     Mental Status: She is alert and oriented to person, place, and time.  Psychiatric:        Mood and Affect: Mood normal.        Behavior: Behavior normal.     INTAKE/OUTPUT:  This shift: Total I/O In: 1522.7 [I.V.:422.7; IV Piggyback:1100] Out: -   Last 2 shifts: @IOLAST2SHIFTS @  Labs:  CBC Latest Ref Rng & Units 08/07/2020 08/07/2020  WBC  4.0 - 10.5 K/uL 8.1 5.6  Hemoglobin 12.0 - 15.0 g/dL 08/09/2020 08/09/2020  Hematocrit 36 - 46 % 39.0 38.0  Platelets 150 - 400 K/uL 270 266   CMP Latest Ref Rng & Units 08/07/2020 08/07/2020 07/31/2020  Glucose 70 - 99 mg/dL 91 08/09/2020) 08/02/2020)  BUN 6 - 20 mg/dL 6 7 5(L)  Creatinine 366(Y - 1.00 mg/dL 403(K 7.42 5.95  Sodium 135 - 145 mmol/L 139 136 136  Potassium 3.5 - 5.1 mmol/L 3.5 3.7 4.3  Chloride 98 - 111 mmol/L 104 104 103  CO2 22 - 32 mmol/L 25 24 24   Calcium 8.9 - 10.3 mg/dL 9.1 9.0 9.5  Total  Protein 6.5 - 8.1 g/dL 7.2 6.1(L) 6.8  Total Bilirubin 0.3 - 1.2 mg/dL 0.9 0.6 0.9  Alkaline Phos 38 - 126 U/L 85 86 95  AST 15 - 41 U/L 23 20 29   ALT 0 - 44 U/L 20 19 24      Imaging studies:   RUQ (08/07/2020) concerning for cholelithiasis with stone in GB neck without gross sonographic evidence of cholecystitis, and radiologist report reviewed:  IMPRESSION: Cholelithiasis with stone impacted within the gallbladder neck. No superimposed sonographic features of acute cholecystitis.   Assessment/Plan: (ICD-10's: K81.0) 19 y.o. female with cholelithiasis and persistent RUQ abdominal pain concerning for acute cholecystitis   - Admit to general surgery  - Plan for robotic assisted laparoscopic cholecystectomy today with Dr 08/09/2020 pending OR/Anesthesia availability    - All risks, benefits, and alternatives to above procedure(s) were discussed with the patient, all of her questions were answered to her expressed satisfaction, patient expresses she wishes to proceed, and informed consent was obtained.  - NPO + IVF resuscitation  - IV Abx (Rocephin)  - pain control prn; antiemetics prn  - Monitor abdominal examination  - DVT prophylaxis; Hold for OR  All of the above findings and recommendations were discussed with the patient, and all of her questions were answered to her expressed satisfaction.  -- 36, PA-C [facility] 08/08/2020, 6:50 AM [phone] M-F: 7am -  4pm

## 2020-08-08 NOTE — Discharge Summary (Signed)
  Patient ID: PHILANA YOUNIS MRN: [id number] DOB/AGE: [birth year] 21 y.o.  Admit date: 08/07/2020 Discharge date: 08/08/2020   Discharge Diagnoses:  Active Problems:   Cholecystitis   Procedures:lap cholecystecomy  Hospital Course:   admitted with findings consistent with acute choecystitis and  was taken promptly to the operating room for an uneventful laparoscopic robotic cholecystectomy . At  The time of discharge the patient was ambulating,  pain was controlled.  Her vital signs were stable and she was afebrile.   physical exam at discharge showed a pt  in no acute distress.  Awake and alert.  Abdomen: Soft incisions healing well without infection or peritonitis.  Extremities well-perfused and no edema.  Condition of the patient the time of discharge was stable     Disposition: Discharge disposition: 01-Home or Self Care       Discharge Instructions    Call MD for:  difficulty breathing, headache or visual disturbances   Complete by: As directed    Call MD for:  extreme fatigue   Complete by: As directed    Call MD for:  hives   Complete by: As directed    Call MD for:  persistant dizziness or light-headedness   Complete by: As directed    Call MD for:  persistant nausea and vomiting   Complete by: As directed    Call MD for:  redness, tenderness, or signs of infection (pain, swelling, redness, odor or green/yellow discharge around incision site)   Complete by: As directed    Call MD for:  severe uncontrolled pain   Complete by: As directed    Call MD for:  temperature >100.4   Complete by: As directed    Diet - low sodium heart healthy   Complete by: As directed    Discharge instructions   Complete by: As directed    Shower 48hrs may apply ice packs and may use OTC iburofen or aleeve   Increase activity slowly   Complete by: As directed    Lifting restrictions   Complete by: As directed    20 lbs x 6 wks     Allergies as of 08/08/2020   No Known  Allergies     Medication List    TAKE these medications   HYDROcodone-acetaminophen 5-325 MG tablet Commonly known as: NORCO/VICODIN Take 1-2 tablets by mouth every 4 (four) hours as needed for moderate pain. What changed:   how much to take  when to take this       Follow-up Information    Andrianna Manalang, [place] F, MD Follow up in 2 week(s).   Specialty: General Surgery Why: virtual Contact information: [address] [place] [address] [phone]                Sterling Big, MD FACS

## 2020-08-09 LAB — SURGICAL PATHOLOGY

## 2020-08-09 NOTE — Anesthesia Postprocedure Evaluation (Signed)
Anesthesia Post Note  Patient: [name]  Procedure(s) Performed: XI ROBOTIC ASSISTED LAPAROSCOPIC CHOLECYSTECTOMY (N/A ) INDOCYANINE GREEN FLUORESCENCE IMAGING (ICG)  Patient location during evaluation: PACU Anesthesia Type: General Level of consciousness: awake and alert Pain management: pain level controlled Vital Signs Assessment: post-procedure vital signs reviewed and stable Respiratory status: spontaneous breathing, nonlabored ventilation and respiratory function stable Cardiovascular status: blood pressure returned to baseline and stable Postop Assessment: no apparent nausea or vomiting Anesthetic complications: no   No complications documented.   Last Vitals:  Vitals:   08/08/20 1243 08/08/20 1413  BP: 111/64 108/74  Pulse: 85 67  Resp: 18   Temp:  36.5 C  SpO2: 99% 100%    Last Pain:  Vitals:   08/08/20 1737  TempSrc:   PainSc: 2                  Karleen Hampshire

## 2022-01-24 ENCOUNTER — Encounter (HOSPITAL_COMMUNITY): Payer: Self-pay | Admitting: Emergency Medicine

## 2022-01-24 ENCOUNTER — Ambulatory Visit (HOSPITAL_COMMUNITY)
Admission: EM | Admit: 2022-01-24 | Discharge: 2022-01-24 | Disposition: A | Discharge status: Home / Self Care | Payer: 59 | Attending: Emergency Medicine | Admitting: Emergency Medicine

## 2022-01-24 ENCOUNTER — Other Ambulatory Visit: Payer: Self-pay

## 2022-01-24 DIAGNOSIS — B9689 Other specified bacterial agents as the cause of diseases classified elsewhere: Secondary | ICD-10-CM | POA: Diagnosis not present

## 2022-01-24 DIAGNOSIS — J019 Acute sinusitis, unspecified: Secondary | ICD-10-CM | POA: Diagnosis not present

## 2022-01-24 DIAGNOSIS — J111 Influenza due to unidentified influenza virus with other respiratory manifestations: Secondary | ICD-10-CM

## 2022-01-24 MED ORDER — ONDANSETRON 4 MG PO TBDP: 4.0000 mg | ORAL_TABLET | Freq: Three times a day (TID) | ORAL | 0 refills | Status: DC | PRN

## 2022-01-24 MED ORDER — AMOXICILLIN-POT CLAVULANATE 875-125 MG PO TABS: 1.0000 | ORAL_TABLET | Freq: Two times a day (BID) | ORAL | 0 refills | Status: DC

## 2022-01-24 NOTE — ED Triage Notes (Signed)
Pt reports sore throat, chills, body aches, cough and emesis x 1 week. States have been taking OTC medication with no relief.

## 2022-01-24 NOTE — Discharge Instructions (Signed)
Use saline nasal spray several times a day to help your congestion drain. Keep taking Mucinex. Focus on staying hydrated with clear liquids for now until your stomach is feeling better/ vomiting has stopped.   Use Imodium for diarrhea as directed on the package. Use the prescription nausea medicine if needed.

## 2022-01-25 NOTE — ED Provider Notes (Signed)
[facility]    CSN: [id number] Arrival date & time: 01/24/22  0900      History   Chief Complaint Chief Complaint  Patient presents with   URI    HPI Angelica Myers is a 21 y.o. female. Pt reports sore throat, chills, body aches, cough and emesis x 1 week. States have been taking OTC medication with no relief.  Reports last week she was tested for COVID, flu, and strep at another facility and all tests were negative.  She felt she was getting better, but now she feels she is getting worse again.  She reports green thick nasal discharge.   URI Presenting symptoms: congestion, cough and sore throat   Presenting symptoms: no fever    History reviewed. No pertinent past medical history.  Patient Active Problem List   Diagnosis Date Noted   Cholecystitis 08/07/2020    History reviewed. No pertinent surgical history.  OB History   No obstetric history on file.      Home Medications    Prior to Admission medications   Medication Sig Start Date End Date Taking? Authorizing Provider  amoxicillin-clavulanate (AUGMENTIN) 875-125 MG tablet Take 1 tablet by mouth every 12 (twelve) hours. 01/24/22  Yes Carvel Getting, NP  ondansetron (ZOFRAN-ODT) 4 MG disintegrating tablet Take 1 tablet (4 mg total) by mouth every 8 (eight) hours as needed for nausea or vomiting. 01/24/22  Yes Carvel Getting, NP  HYDROcodone-acetaminophen (NORCO/VICODIN) 5-325 MG tablet Take 1-2 tablets by mouth every 4 (four) hours as needed for moderate pain. 08/08/20   Jules Husbands, MD    Family History Family History  Problem Relation Age of Onset   Healthy Mother    Healthy Father     Social History Social History   Tobacco Use   Smoking status: Never    Passive exposure: Yes   Smokeless tobacco: Never  Vaping Use   Vaping Use: Never used  Substance Use Topics   Alcohol use: No     Allergies   Patient has no known allergies.   Review of Systems Review of Systems   Constitutional:  Negative for chills and fever.  HENT:  Positive for congestion, postnasal drip, sinus pressure and sore throat.   Respiratory:  Positive for cough.   Gastrointestinal:  Positive for diarrhea, nausea and vomiting. Negative for abdominal pain.    Physical Exam Triage Vital Signs ED Triage Vitals  Enc Vitals Group     BP 01/24/22 0936 107/71     Pulse Rate 01/24/22 0936 86     Resp 01/24/22 0936 16     Temp 01/24/22 0936 97.6 F (36.4 C)     Temp Source 01/24/22 0936 Oral     SpO2 01/24/22 0936 99 %     Weight 01/24/22 0935 198 lb 6.6 oz (90 kg)     Height 01/24/22 0935 5\' 2"  (1.575 m)     Head Circumference --      Peak Flow --      Pain Score 01/24/22 0934 5     Pain Loc --      Pain Edu? --      Excl. in [place]? --    No data found.  Updated Vital Signs BP 107/71 (BP Location: Left Arm)    Pulse 86    Temp 97.6 F (36.4 C) (Oral)    Resp 16    Ht 5\' 2"  (1.575 m)    Wt 198 lb 6.6 oz (  90 kg)    SpO2 99%    BMI 36.29 kg/m   Visual Acuity Right Eye Distance:   Left Eye Distance:   Bilateral Distance:    Right Eye Near:   Left Eye Near:    Bilateral Near:     Physical Exam Constitutional:      General: She is not in acute distress.    Appearance: Normal appearance. She is not ill-appearing.  HENT:     Right Ear: Tympanic membrane, ear canal and external ear normal.     Left Ear: Tympanic membrane, ear canal and external ear normal.     Nose:     Right Sinus: No maxillary sinus tenderness or frontal sinus tenderness.     Left Sinus: Frontal sinus tenderness present. No maxillary sinus tenderness.     Mouth/Throat:     Mouth: Mucous membranes are moist.     Pharynx: Oropharynx is clear.  Cardiovascular:     Rate and Rhythm: Normal rate and regular rhythm.  Pulmonary:     Effort: Pulmonary effort is normal.     Breath sounds: Normal breath sounds.  Abdominal:     General: Abdomen is flat. Bowel sounds are normal.     Tenderness: There is no  abdominal tenderness. There is no guarding or rebound.  Neurological:     Mental Status: She is alert.     UC Treatments / Results  Labs (all labs ordered are listed, but only abnormal results are displayed) Labs Reviewed - No data to display  EKG   Radiology No results found.  Procedures Procedures (including critical care time)  Medications Ordered in UC Medications - No data to display  Initial Impression / Assessment and Plan / UC Course  I have reviewed the triage vital signs and the nursing notes.  Pertinent labs & imaging results that were available during my care of the patient were reviewed by me and considered in my medical decision making (see chart for details).    Given duration of symptoms and onset of purulent nasal drainage, I suspect she has a bacterial sinus infection after her viral infection.  Discussed supportive care measures, prescribed Augmentin, and prescribed Zofran.  Final Clinical Impressions(s) / UC Diagnoses   Final diagnoses:  Influenza-like illness  Acute bacterial sinusitis     Discharge Instructions      Use saline nasal spray several times a day to help your congestion drain. Keep taking Mucinex. Focus on staying hydrated with clear liquids for now until your stomach is feeling better/ vomiting has stopped.   Use Imodium for diarrhea as directed on the package. Use the prescription nausea medicine if needed.    ED Prescriptions     Medication Sig Dispense Auth. Provider   ondansetron (ZOFRAN-ODT) 4 MG disintegrating tablet Take 1 tablet (4 mg total) by mouth every 8 (eight) hours as needed for nausea or vomiting. 20 tablet Carvel Getting, NP   amoxicillin-clavulanate (AUGMENTIN) 875-125 MG tablet Take 1 tablet by mouth every 12 (twelve) hours. 14 tablet Carvel Getting, NP      PDMP not reviewed this encounter.   Carvel Getting, NP 01/25/22 [phone]

## 2022-03-12 ENCOUNTER — Ambulatory Visit (HOSPITAL_COMMUNITY)
Admission: EM | Admit: 2022-03-12 | Discharge: 2022-03-12 | Disposition: A | Discharge status: Home / Self Care | Payer: 59 | Attending: Sports Medicine | Admitting: Sports Medicine

## 2022-03-12 ENCOUNTER — Encounter (HOSPITAL_COMMUNITY): Payer: Self-pay

## 2022-03-12 DIAGNOSIS — R052 Subacute cough: Secondary | ICD-10-CM | POA: Diagnosis not present

## 2022-03-12 DIAGNOSIS — K219 Gastro-esophageal reflux disease without esophagitis: Secondary | ICD-10-CM

## 2022-03-12 DIAGNOSIS — R067 Sneezing: Secondary | ICD-10-CM

## 2022-03-12 MED ORDER — FAMOTIDINE 20 MG PO TABS: 20.0000 mg | ORAL_TABLET | Freq: Two times a day (BID) | ORAL | 0 refills | Status: DC

## 2022-03-12 MED ORDER — FLUTICASONE PROPIONATE 50 MCG/ACT NA SUSP: 1.0000 | Freq: Every day | NASAL | 0 refills | Status: DC

## 2022-03-12 NOTE — ED Triage Notes (Signed)
Pt presents with cough x 3-4 weeks ?

## 2022-03-12 NOTE — ED Provider Notes (Signed)
?[facility] ? ? ? ?CSN: [id number] ?Arrival date & time: 03/12/22  [id number] ? ? ?  ? ?History   ?Chief Complaint ?Chief Complaint  ?Patient presents with  ? Cough  ? ? ?HPI ?Angelica Myers is a 21 y.o. female with cough x 3-4 weeks. ? ? ?Cough ?Associated symptoms: shortness of breath (intermittent)   ?Associated symptoms: no chest pain, no chills, no fever, no headaches, no rash and no wheezing   ? ?Having cough for 3-4 weeks. ?Treated for sinus infection on 02/14/22 and had mild improvement in cough but complete resolution of other symptoms (congestion, nasal, etc.) ?All symptoms have improved other than the cough ?Cough is mostly dry, sometimes has some green mucous or phlegm  ?No fever/chills ? ?Reports some shortness of breath when trying to take deep breath ?No history of asthma; denies any wheezing ?No chest pain ?Right side hurts when she coughs of lateral rib cage  ? ?Some sneezing ?Denies history of season allergies  ?Cough worse in morning ?Does have unusual taste in mouth AM, metallic-like  ?Does have burning like symptom in chest/throat at times ? ?History reviewed. No pertinent past medical history. ? ?Patient Active Problem List  ? Diagnosis Date Noted  ? Cholecystitis 08/07/2020  ? ? ?History reviewed. No pertinent surgical history. ? ?OB History   ?No obstetric history on file. ?  ? ? ? ?Home Medications   ? ?Prior to Admission medications   ?Medication Sig Start Date End Date Taking? Authorizing Provider  ?famotidine (PEPCID) 20 MG tablet Take 1 tablet (20 mg total) by mouth 2 (two) times daily. 03/12/22 04/11/22 Yes Madelyn BrunnerBrooks, Shireen Rayburn, DO  ?fluticasone (FLONASE) 50 MCG/ACT nasal spray Place 1 spray into both nostrils daily. 03/12/22 04/11/22 Yes Madelyn BrunnerBrooks, Janise Gora, DO  ?amoxicillin-clavulanate (AUGMENTIN) 875-125 MG tablet Take 1 tablet by mouth every 12 (twelve) hours. ?Patient not taking: Reported on 03/12/2022 01/24/22   Cathlyn ParsonsKabbe, Angela M, NP  ?HYDROcodone-acetaminophen (NORCO/VICODIN) 5-325 MG tablet Take  1-2 tablets by mouth every 4 (four) hours as needed for moderate pain. ?Patient not taking: Reported on 03/12/2022 08/08/20   Leafy RoPabon, Diego F, MD  ?ondansetron (ZOFRAN-ODT) 4 MG disintegrating tablet Take 1 tablet (4 mg total) by mouth every 8 (eight) hours as needed for nausea or vomiting. ?Patient not taking: Reported on 03/12/2022 01/24/22   Cathlyn ParsonsKabbe, Angela M, NP  ? ? ?Family History ?Family History  ?Problem Relation Age of Onset  ? Healthy Mother   ? Healthy Father   ? ? ?Social History ?Social History  ? ?Tobacco Use  ? Smoking status: Never  ?  Passive exposure: Yes  ? Smokeless tobacco: Never  ?Vaping Use  ? Vaping Use: Never used  ?Substance Use Topics  ? Alcohol use: No  ? ? ? ?Allergies   ?Patient has no known allergies. ? ? ?Review of Systems ?Review of Systems  ?Constitutional:  Negative for activity change, chills and fever.  ?HENT:  Positive for sneezing. Negative for congestion.   ?Respiratory:  Positive for cough and shortness of breath (intermittent). Negative for wheezing.   ?Cardiovascular:  Negative for chest pain and palpitations.  ?Skin:  Negative for rash.  ?Neurological:  Negative for weakness and headaches.  ? ? ?Physical Exam ?Triage Vital Signs ?ED Triage Vitals  ?Enc Vitals Group  ?   BP 03/12/22 0923 101/67  ?   Pulse Rate 03/12/22 0923 87  ?   Resp 03/12/22 0923 14  ?   Temp 03/12/22 0923 98.7 ?F (37.1 ?C)  ?  Temp Source 03/12/22 0923 Oral  ?   SpO2 03/12/22 0923 98 %  ?   Weight --   ?   Height --   ?   Head Circumference --   ?   Peak Flow --   ?   Pain Score 03/12/22 0924 2  ?   Pain Loc --   ?   Pain Edu? --   ?   Excl. in GC? --   ? ?No data found. ? ?Updated Vital Signs ?BP 101/67 (BP Location: Right Arm)   Pulse 87   Temp 98.7 ?F (37.1 ?C) (Oral)   Resp 14   SpO2 98%  ? ?Physical Exam ?Constitutional:   ?   General: She is not in acute distress. ?   Appearance: She is not ill-appearing or toxic-appearing.  ?HENT:  ?   Head: Normocephalic and atraumatic.  ?   Right Ear: Ear canal  and external ear normal.  ?   Left Ear: Ear canal and external ear normal.  ?   Nose: Nose normal.  ?   Comments: + mild bogginess of nasal mucosa ?   Mouth/Throat:  ?   Mouth: Mucous membranes are moist.  ?   Pharynx: No posterior oropharyngeal erythema.  ?   Comments: + post cobblestoning ?Eyes:  ?   Extraocular Movements: Extraocular movements intact.  ?   Conjunctiva/sclera: Conjunctivae normal.  ?   Pupils: Pupils are equal, round, and reactive to light.  ?Cardiovascular:  ?   Rate and Rhythm: Normal rate.  ?   Pulses: Normal pulses.  ?   Heart sounds: No murmur heard. ?  No friction rub. No gallop.  ?Pulmonary:  ?   Effort: Pulmonary effort is normal. No respiratory distress.  ?   Breath sounds: No wheezing, rhonchi or rales.  ?Abdominal:  ?   General: Abdomen is flat.  ?   Palpations: Abdomen is soft.  ?Lymphadenopathy:  ?   Cervical: No cervical adenopathy.  ?Skin: ?   Capillary Refill: Capillary refill takes less than 2 seconds.  ?Neurological:  ?   General: No focal deficit present.  ?   Mental Status: She is alert.  ?Psychiatric:     ?   Mood and Affect: Mood normal.     ?   Thought Content: Thought content normal.  ? ? ? ?UC Treatments / Results  ?Labs ?(all labs ordered are listed, but only abnormal results are displayed) ?Labs Reviewed - No data to display ? ?EKG ? ? ?Radiology ?No results found. ? ?Procedures ?Procedures (including critical care time) ? ?Medications Ordered in UC ?Medications - No data to display ? ?Initial Impression / Assessment and Plan / UC Course  ?I have reviewed the triage vital signs and the nursing notes. ? ?Pertinent labs & imaging results that were available during my care of the patient were reviewed by me and considered in my medical decision making (see chart for details). ? ?  ? ?Patient presents with subacute cough x3-4 weeks.  Treated for sinusitis almost 4 weeks ago and remainder of symptoms have improved other than resolving mostly nonproductive cough.  Patient does  report symptoms that are suggestive of GERD as well so we will treat for this.  Lungs sound completely clear to auscultation, no indication for imaging today.  We will treat with Pepcid 20 mg twice daily for the next 30 days.  We will also do Flonase to help clear the nasal passages and treat any allergy component.  Did provide information for local family medicine center for her to establish care with.  If her symptoms return or she has any new onset shortness of breath, chest pain she is to return here to the emergency room right away. Safe for discharge home. ?Final Clinical Impressions(s) / UC Diagnoses  ? ?Final diagnoses:  ?Subacute cough  ?Gastroesophageal reflux disease, unspecified whether esophagitis present  ?Sneezing  ? ? ? ?Discharge Instructions   ? ?  ?Begin a trial of Pepcid, 1 tablet twice a day ?Try conservative measures for reflux, see the handout I attached for you ? ?Use Flonase into each nostril, 1 spray daily for the next month ? ?Follow-up with family medicine if symptoms are not improving over the next few weeks ? ? ? ? ? ?ED Prescriptions   ? ? Medication Sig Dispense Auth. Provider  ? famotidine (PEPCID) 20 MG tablet Take 1 tablet (20 mg total) by mouth 2 (two) times daily. 60 tablet Madelyn Brunner, DO  ? fluticasone (FLONASE) 50 MCG/ACT nasal spray Place 1 spray into both nostrils daily. 1 g Madelyn Brunner, DO  ? ?  ? ?PDMP not reviewed this encounter. ?  Madelyn Brunner, DO ?03/12/22 1054 ? ?

## 2022-03-12 NOTE — Discharge Instructions (Addendum)
Begin a trial of Pepcid, 1 tablet twice a day ?Try conservative measures for reflux, see the handout I attached for you ? ?Use Flonase into each nostril, 1 spray daily for the next month ? ?Follow-up with family medicine if symptoms are not improving over the next few weeks ? ?

## 2022-03-31 IMAGING — CT CT RENAL STONE PROTOCOL
2 of 4 series · 16 of 46 positions shown, 18 images · non-contrast
Comparison: None.

CLINICAL DATA: Right-sided flank pain

EXAM:
CT ABDOMEN AND PELVIS WITHOUT CONTRAST
TECHNIQUE: Multidetector CT imaging of the abdomen and pelvis was performed
following the standard protocol without IV contrast.

[Series 3: ap without · axial · non-contrast · 0.84mm/px · z∈[-434,-14]mm · 13 of 94 slices shown, 15 images]
[im 5/94  soft-tissue]
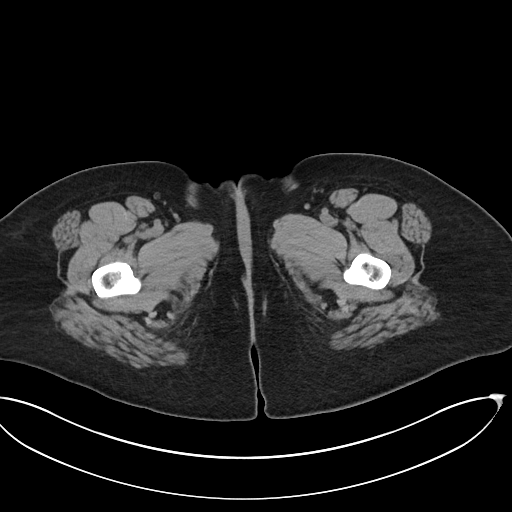
[im 5/94  bone]
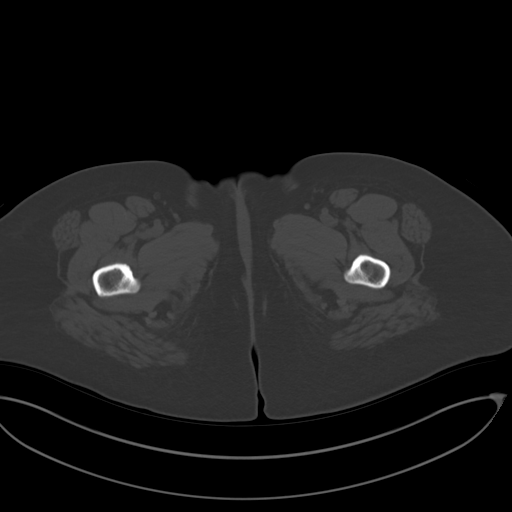
[im 15/94  soft-tissue]
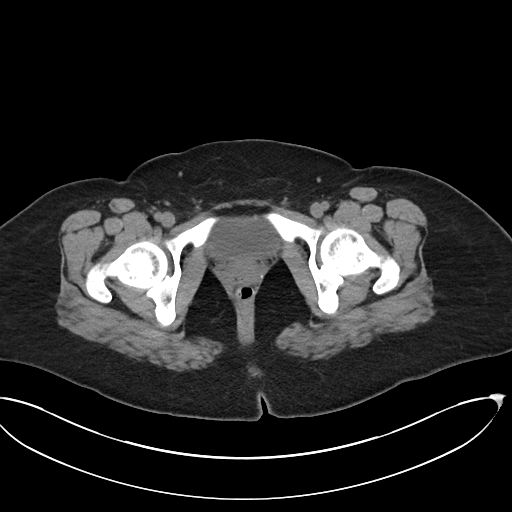
[im 20/94  soft-tissue]
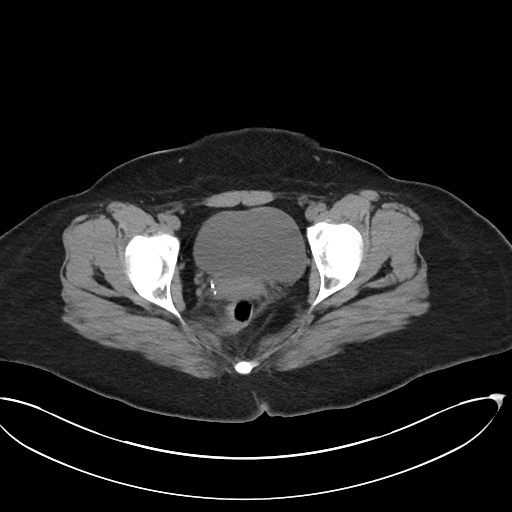
[im 25/94  soft-tissue]
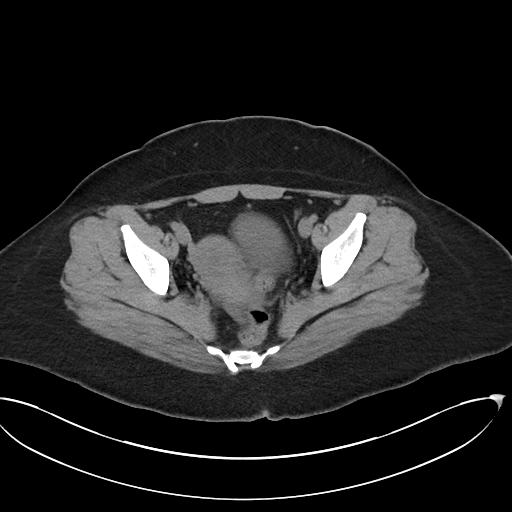
[im 35/94  soft-tissue]
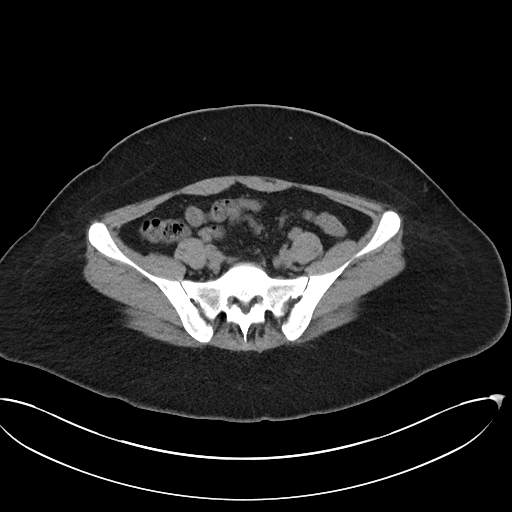
[im 40/94  soft-tissue]
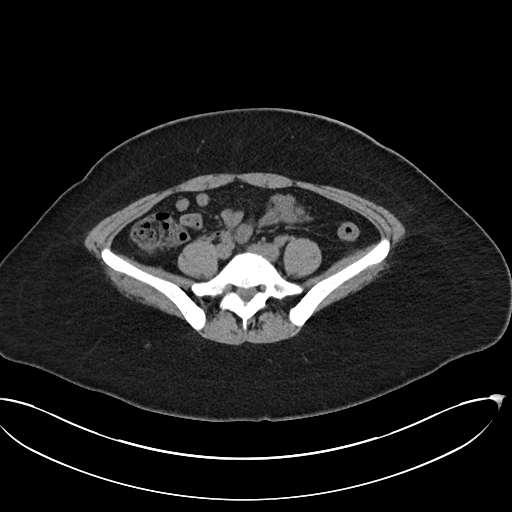
[im 49/94  soft-tissue]
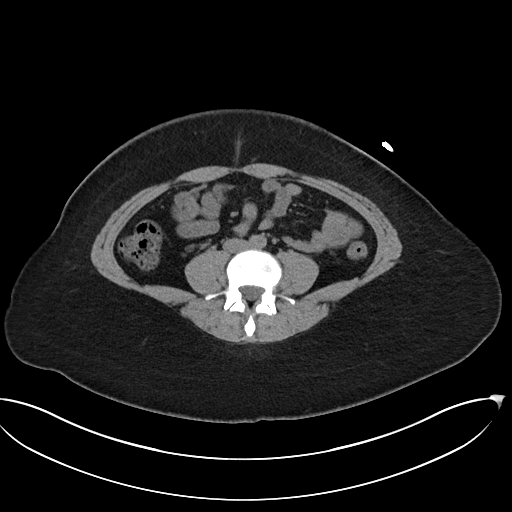
[im 54/94  soft-tissue]
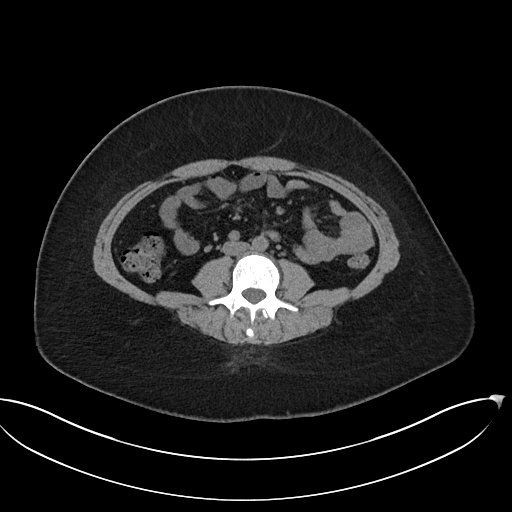
[im 59/94  soft-tissue]
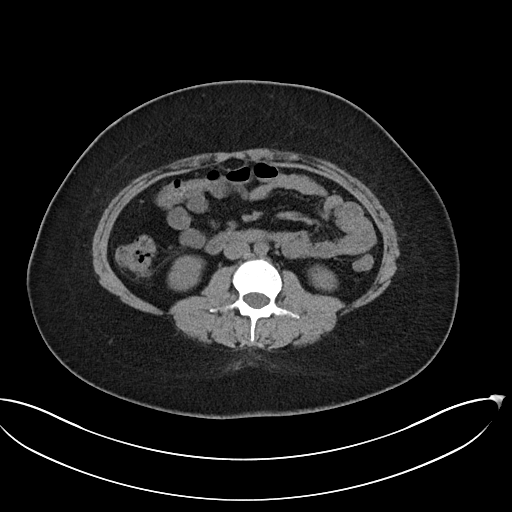
[im 59/94  bone]
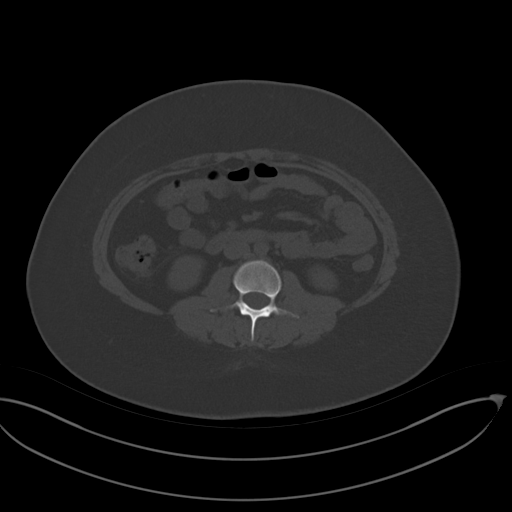
[im 69/94  soft-tissue]
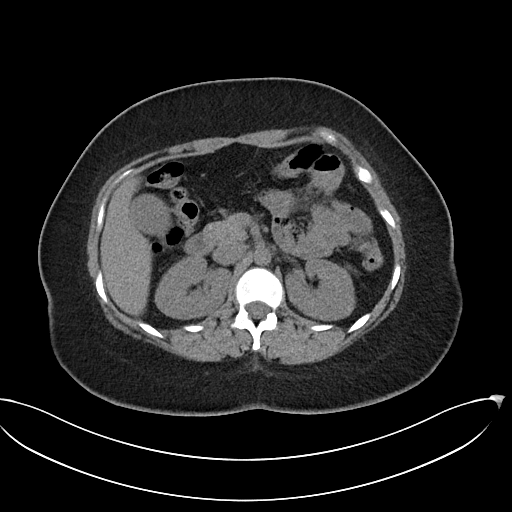
[im 74/94  soft-tissue]
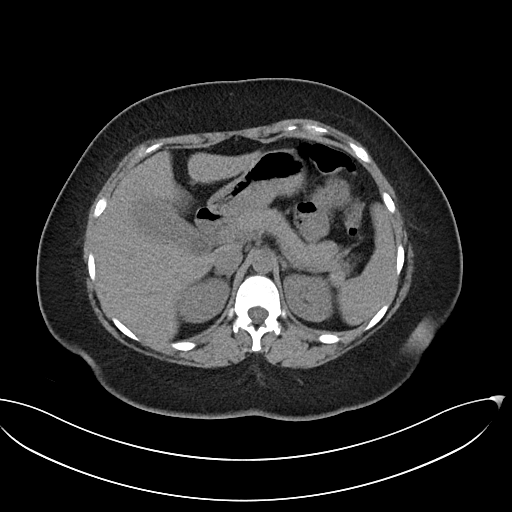
[im 79/94  soft-tissue]
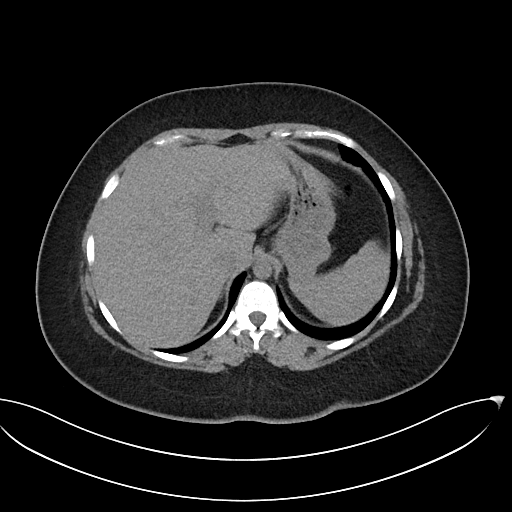
[im 89/94  soft-tissue]
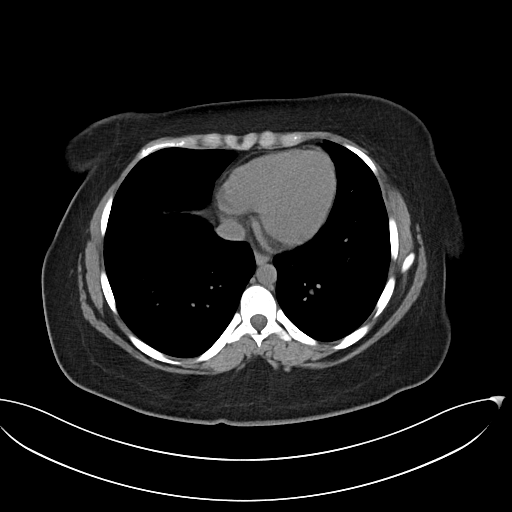

[Series 6: cor · coronal · 0.72mm/px · 3 of 99 slices shown]
[im 33/99  soft-tissue]
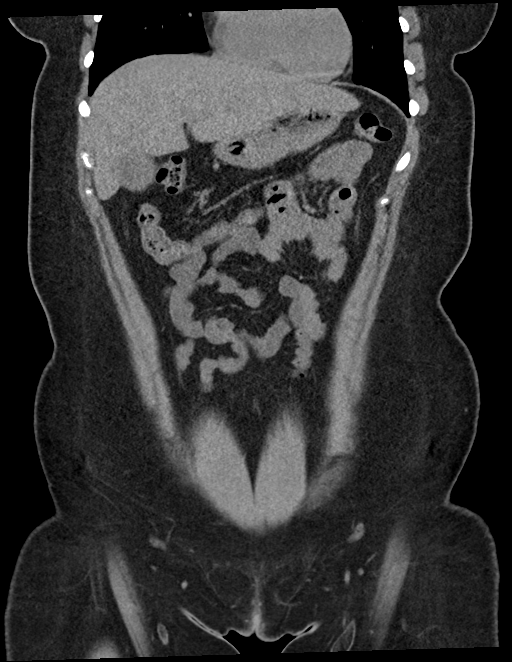
[im 44/99  soft-tissue]
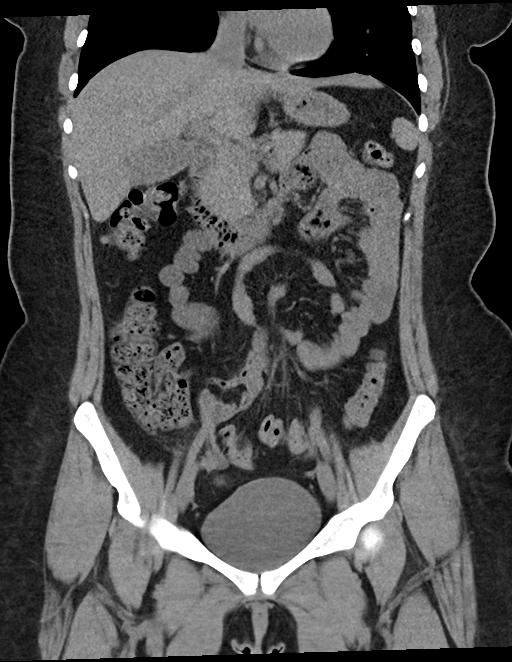
[im 55/99  soft-tissue]
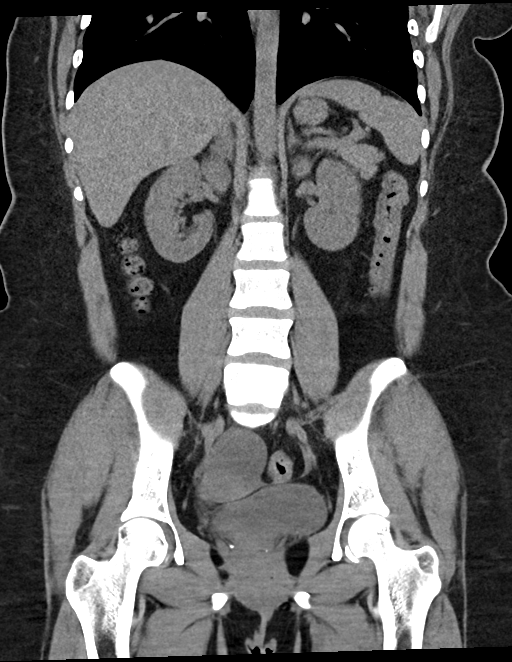

[16 of 46 positions shown; findings below may reference images not displayed]

FINDINGS: Lower chest: No acute abnormality.

Hepatobiliary: Subcentimeter hypodensity within the anterior liver
too small to further characterize. No biliary dilatation. Relative
hypodensity in the fundus and neck of the gallbladder. No
inflammatory changes at the right upper quadrant.

Pancreas: Unremarkable. No pancreatic ductal dilatation or
surrounding inflammatory changes.

Spleen: Normal in size without focal abnormality.

Adrenals/Urinary Tract: Adrenal glands are unremarkable. Kidneys are
normal, without renal calculi, focal lesion, or hydronephrosis.
Bladder is unremarkable.

Stomach/Bowel: Stomach is within normal limits. Appendix appears
normal. No evidence of bowel wall thickening, distention, or
inflammatory changes.

Vascular/Lymphatic: No significant vascular findings are present. No
enlarged abdominal or pelvic lymph nodes.

Reproductive: Uterus is unremarkable. 4.1 cm benign-appearing right
adnexal cyst, no further imaging is required.

Other: Negative for free air or free fluid.

Musculoskeletal: No acute or significant osseous findings.
IMPRESSION: 1. Negative for nephrolithiasis, hydronephrosis, or ureteral stone.
2. Possible noncalcified stones within the gallbladder. Correlation
with ultrasound may be obtained as clinically indicated.
3. 4.1 cm benign-appearing right adnexal cyst, no further imaging is
required unless torsion is suspected clinically.

## 2022-09-10 ENCOUNTER — Ambulatory Visit (HOSPITAL_COMMUNITY)
Admission: EM | Admit: 2022-09-10 | Discharge: 2022-09-10 | Disposition: A | Discharge status: Home / Self Care | Payer: Commercial Managed Care - PPO | Attending: Family Medicine | Admitting: Family Medicine

## 2022-09-10 ENCOUNTER — Encounter (HOSPITAL_COMMUNITY): Payer: Self-pay | Admitting: *Deleted

## 2022-09-10 ENCOUNTER — Other Ambulatory Visit: Payer: Self-pay

## 2022-09-10 DIAGNOSIS — Z20822 Contact with and (suspected) exposure to covid-19: Secondary | ICD-10-CM | POA: Insufficient documentation

## 2022-09-10 DIAGNOSIS — J069 Acute upper respiratory infection, unspecified: Secondary | ICD-10-CM | POA: Insufficient documentation

## 2022-09-10 MED ORDER — PROMETHAZINE-DM 6.25-15 MG/5ML PO SYRP: 5.0000 mL | ORAL_SOLUTION | Freq: Four times a day (QID) | ORAL | 0 refills | Status: DC | PRN

## 2022-09-10 NOTE — Discharge Instructions (Addendum)
You may use over the counter ibuprofen or acetaminophen as needed.  For a sore throat, over the counter products such as [employer] Peroxyl Mouth Sore Rinse or Chloraseptic Sore Throat Spray may provide some temporary relief. You have been tested for COVID-19 today. If your test returns positive, you will receive a phone call from [facility] regarding your results. Negative test results are not called. Both positive and negative results area always visible on MyChart. If you do not have a MyChart account, sign up instructions are provided in your discharge papers. Please do not hesitate to contact us should you have questions or concerns.    

## 2022-09-10 NOTE — ED Triage Notes (Signed)
Pt reports sore throat ,congestion and HA since Friday. Pt also has problems with constipation for Friday . Pt has tried OTC for constipation and cough with out relief.

## 2022-09-11 LAB — SARS CORONAVIRUS 2 (TAT 6-24 HRS): SARS Coronavirus 2: NEGATIVE

## 2022-09-12 NOTE — ED Provider Notes (Signed)
  [place]   [id number] 09/10/22 Arrival Time: 6269  ASSESSMENT & PLAN:  1. Viral URI with cough    Discussed typical duration of viral illnesses. COVID testing negative. OTC symptom care as needed.  Discharge Medication List as of 09/10/2022  9:37 AM     START taking these medications   Details  promethazine-dextromethorphan (PROMETHAZINE-DM) 6.25-15 MG/5ML syrup Take 5 mLs by mouth 4 (four) times daily as needed for cough., Starting Mon 09/10/2022, Normal         Follow-up Information     [facility] at [facility].   Specialty: Urgent Care Why: As needed. Contact information: [place] [phone]                Reviewed expectations re: course of current medical issues. Questions answered. Outlined signs and symptoms indicating need for more acute intervention. Understanding verbalized. After Visit Summary given.   SUBJECTIVE: History from: Patient. Angelica Myers is a 21 y.o. female. Pt reports sore throat ,congestion and HA since Friday. Tired. No sick contacts. Normal PO intake without n/v/d.  OBJECTIVE:  Vitals:   09/10/22 0919  BP: 107/70  Pulse: 89  Resp: 18  Temp: 98.9 F (37.2 C)  SpO2: 98%    General appearance: alert; no distress Eyes: PERRLA; EOMI; conjunctiva normal HENT: [place]; AT; with nasal congestion Neck: supple  Lungs: speaks full sentences without difficulty; unlabored Extremities: no edema Skin: warm and dry Neurologic: normal gait Psychological: alert and cooperative; normal mood and affect  Labs: Results for orders placed or performed during the hospital encounter of 09/10/22  SARS CORONAVIRUS 2 (TAT 6-24 HRS) Anterior Nasal Swab   Specimen: Anterior Nasal Swab  Result Value Ref Range   SARS Coronavirus 2 NEGATIVE NEGATIVE   Labs Reviewed  SARS CORONAVIRUS 2 (TAT 6-24 HRS)     No Known Allergies  History reviewed. No pertinent past medical  history. Social History   Socioeconomic History   Marital status: Single    Spouse name: Not on file   Number of children: Not on file   Years of education: Not on file   Highest education level: Not on file  Occupational History   Not on file  Tobacco Use   Smoking status: Never    Passive exposure: Yes   Smokeless tobacco: Never  Vaping Use   Vaping Use: Never used  Substance and Sexual Activity   Alcohol use: No   Drug use: Not on file   Sexual activity: Not on file  Other Topics Concern   Not on file  Social History Narrative   Not on file   Social Determinants of Health   Financial Resource Strain: Not on file  Food Insecurity: Not on file  Transportation Needs: Not on file  Physical Activity: Not on file  Stress: Not on file  Social Connections: Not on file  Intimate Partner Violence: Not on file   Family History  Problem Relation Age of Onset   Healthy Mother    Healthy Father    History reviewed. No pertinent surgical history.   Vanessa Kick, MD 09/12/22 1125

## 2022-10-08 ENCOUNTER — Telehealth: Payer: Commercial Managed Care - PPO | Admitting: Family Medicine

## 2022-10-08 DIAGNOSIS — U071 COVID-19: Secondary | ICD-10-CM

## 2022-10-08 DIAGNOSIS — A059 Bacterial foodborne intoxication, unspecified: Secondary | ICD-10-CM | POA: Diagnosis not present

## 2022-10-08 NOTE — Progress Notes (Signed)
Virtual Visit Consent   Angelica Myers, you are scheduled for a virtual visit with a [facility] provider today. Just as with appointments in the office, your consent must be obtained to participate. Your consent will be active for this visit and any virtual visit you may have with one of our providers in the next 365 days. If you have a MyChart account, a copy of this consent can be sent to you electronically.  As this is a virtual visit, video technology does not allow for your provider to perform a traditional examination. This may limit your provider's ability to fully assess your condition. If your provider identifies any concerns that need to be evaluated in person or the need to arrange testing (such as labs, EKG, etc.), we will make arrangements to do so. Although advances in technology are sophisticated, we cannot ensure that it will always work on either your end or our end. If the connection with a video visit is poor, the visit may have to be switched to a telephone visit. With either a video or telephone visit, we are not always able to ensure that we have a secure connection.  By engaging in this virtual visit, you consent to the provision of healthcare and authorize for your insurance to be billed (if applicable) for the services provided during this visit. Depending on your insurance coverage, you may receive a charge related to this service.  I need to obtain your verbal consent now. Are you willing to proceed with your visit today? Angelica Myers has provided verbal consent on 10/08/2022 for a virtual visit (video or telephone). Freddy Finner, NP  Date: 10/08/2022 11:00 AM  Virtual Visit via Video Note   I, Freddy Finner, connected with  Angelica Myers  ([id number], [birth year]) on 10/08/22 at 11:00 AM EDT by a video-enabled telemedicine application and verified that I am speaking with the correct person using two identifiers.  Location: Patient: Virtual Visit Location  Patient: Home Provider: Virtual Visit Location Provider: Home Office   I discussed the limitations of evaluation and management by telemedicine and the availability of in person appointments. The patient expressed understanding and agreed to proceed.    History of Present Illness: Angelica Myers is a 21 y.o. who identifies as a female who was assigned female at birth, and is being seen today for COVID +   Covid: Started symptoms on 10/06/2022- with coughing and tired. Progressed to having runny nose, congestion, tired, coughing, fever over weekend was T max 101.Denies fevers chills, chest pain or shortness of breath as of now. Modifying factors include tylenol.   Vomiting and Diarrhea: Questionable food poison- had wing stop wings, no fever but chills. Denies blood in emesis, some mild blood on stool-unknown hemorrhoid history. Does have history- of constipation.  Last episode of vomiting and diarrhea was 30 mins prior to video visit. She reports vomiting 4 times and diarrhea around 6 times. These symptoms started about 3-4 hours after meal.    Problems:  Patient Active Problem List   Diagnosis Date Noted   Cholecystitis 08/07/2020    Allergies: No Known Allergies Medications:  Current Outpatient Medications:    amoxicillin-clavulanate (AUGMENTIN) 875-125 MG tablet, Take 1 tablet by mouth every 12 (twelve) hours. (Patient not taking: Reported on 03/12/2022), Disp: 14 tablet, Rfl: 0   famotidine (PEPCID) 20 MG tablet, Take 1 tablet (20 mg total) by mouth 2 (two) times daily., Disp: 60 tablet, Rfl: 0   fluticasone (FLONASE)  50 MCG/ACT nasal spray, Place 1 spray into both nostrils daily., Disp: 1 g, Rfl: 0   HYDROcodone-acetaminophen (NORCO/VICODIN) 5-325 MG tablet, Take 1-2 tablets by mouth every 4 (four) hours as needed for moderate pain. (Patient not taking: Reported on 03/12/2022), Disp: 15 tablet, Rfl: 0   ondansetron (ZOFRAN-ODT) 4 MG disintegrating tablet, Take 1 tablet (4 mg total) by  mouth every 8 (eight) hours as needed for nausea or vomiting. (Patient not taking: Reported on 03/12/2022), Disp: 20 tablet, Rfl: 0   promethazine-dextromethorphan (PROMETHAZINE-DM) 6.25-15 MG/5ML syrup, Take 5 mLs by mouth 4 (four) times daily as needed for cough., Disp: 118 mL, Rfl: 0  Observations/Objective: Patient is well-developed, well-nourished in no acute distress.  Resting comfortably  at home.  Head is normocephalic, atraumatic.  No labored breathing.  Speech is clear and coherent with logical content.  Patient is alert and oriented at baseline.    Assessment and Plan: 1. COVID-19   2. Food poisoning  -OTC reviewed given symptoms are mild for COVID -advised of bland diet and to let food poisoning run course if able- otherwise OTC reviewed -in person needed if blood in stool continues- strict in person reviewed   Reviewed side effects, risks and benefits of medication.    Patient acknowledged agreement and understanding of the plan.   Past Medical, Surgical, Social History, Allergies, and Medications have been Reviewed.      Follow Up Instructions: I discussed the assessment and treatment plan with the patient. The patient was provided an opportunity to ask questions and all were answered. The patient agreed with the plan and demonstrated an understanding of the instructions.  A copy of instructions were sent to the patient via MyChart unless otherwise noted below.     The patient was advised to call back or seek an in-person evaluation if the symptoms worsen or if the condition fails to improve as anticipated.  Time:  I spent 10 minutes with the patient via telehealth technology discussing the above problems/concerns.    Perlie Mayo, NP

## 2022-10-08 NOTE — Patient Instructions (Signed)
Angelica Myers, thank you for joining Perlie Mayo, NP for today's virtual visit.  While this provider is not your primary care provider (PCP), if your PCP is located in our provider database this encounter information will be shared with them immediately following your visit.   [place] account gives you access to today's visit and all your visits, tests, and labs performed at [facility] " click here if you don't have a [place] account or go to [email or web address]  Consent: (Patient) Angelica Myers provided verbal consent for this virtual visit at the beginning of the encounter.  Current Medications:  Current Outpatient Medications:    amoxicillin-clavulanate (AUGMENTIN) 875-125 MG tablet, Take 1 tablet by mouth every 12 (twelve) hours. (Patient not taking: Reported on 03/12/2022), Disp: 14 tablet, Rfl: 0   famotidine (PEPCID) 20 MG tablet, Take 1 tablet (20 mg total) by mouth 2 (two) times daily., Disp: 60 tablet, Rfl: 0   fluticasone (FLONASE) 50 MCG/ACT nasal spray, Place 1 spray into both nostrils daily., Disp: 1 g, Rfl: 0   HYDROcodone-acetaminophen (NORCO/VICODIN) 5-325 MG tablet, Take 1-2 tablets by mouth every 4 (four) hours as needed for moderate pain. (Patient not taking: Reported on 03/12/2022), Disp: 15 tablet, Rfl: 0   ondansetron (ZOFRAN-ODT) 4 MG disintegrating tablet, Take 1 tablet (4 mg total) by mouth every 8 (eight) hours as needed for nausea or vomiting. (Patient not taking: Reported on 03/12/2022), Disp: 20 tablet, Rfl: 0   promethazine-dextromethorphan (PROMETHAZINE-DM) 6.25-15 MG/5ML syrup, Take 5 mLs by mouth 4 (four) times daily as needed for cough., Disp: 118 mL, Rfl: 0   Medications ordered in this encounter:  No orders of the defined types were placed in this encounter.    *If you need refills on other medications prior to your next appointment, please contact your pharmacy*  Follow-Up: Call back or seek an  in-person evaluation if the symptoms worsen or if the condition fails to improve as anticipated.  [facility] [phone]  Other Instructions  Criss Rosales Diet A bland diet may consist of soft foods or foods that are not high in fat or are not greasy, acidic, or spicy. Avoiding certain foods may cause less irritation to your mouth, throat, stomach, or gastrointestinal tract. Avoiding certain foods may make you feel better. Everyone's tolerances are different. A bland diet should be based on what you can tolerate and what may cause discomfort. What is my plan? Your health care provider or dietitian may recommend specific changes to your diet to treat your symptoms. These changes may include: Eating small meals frequently. Cooking food until it is soft enough to chew easily. Taking the time to chew your food thoroughly, so it is easy to swallow and digest. Avoiding foods that cause you discomfort. These may include spicy food, fried food, greasy foods, hard-to-chew foods, or citrus fruits and juices. Drinking slowly. What are tips for following this plan? Reading food labels To reduce fiber intake, look for food labels that say "whole," such as whole wheat or whole grain. Shopping Avoid food items that may have nuts or seeds. Avoid vegetables that may make you gassy or have a tough texture, such as broccoli, cauliflower, or corn. Cooking Cook foods thoroughly so they have a soft texture. Meal planning Make sure you include foods from all food groups to eat a balanced diet. Eat a variety of types of foods. Eat foods and drink beverages that do not cause you discomfort. These may  include soups and broths with cooked meats, pasta, and vegetables. Lifestyle Sit up after meals, avoid tight clothing, and take time to eat and chew your food slowly. Ask your health care provider whether you should take dietary supplements. General information Mildly season your foods. Some seasonings,  such as cayenne pepper, vinegar, or hot sauce, may cause irritation. The foods, beverages, or seasonings to avoid should be based on individual tolerance. What foods should I eat? Fruits Canned or cooked fruit such as peaches, pears, or applesauce. Bananas. Vegetables Well-cooked vegetables. Canned or cooked vegetables such as carrots, green beans, beets, or spinach. Mashed or boiled potatoes. Grains  Hot cereals, such as cream of wheat and processed oatmeal. Rice. Bread, crackers, pasta, or tortillas made from refined white flour. Meats and other proteins  Eggs. Creamy peanut butter or other nut butters. Lean, well-cooked tender meats, such as beef, pork, chicken, or fish. Dairy Low-fat dairy products such as milk, cottage cheese, or yogurt. Beverages  Water. Herbal tea. Apple juice. Fats and oils Mild salad dressings. Canola or olive oil. Sweets and desserts Low-fat pudding, custard, or ice cream. Fruit gelatin. The items listed above may not be a complete list of foods and beverages you can eat. Contact a dietitian for more information. What foods should I avoid? Fruits Citrus fruits, such as oranges and grapefruit. Fruits with a stringy texture. Fruits that have lots of seeds, such as kiwi or strawberries. Dried fruits. Vegetables Raw, uncooked vegetables. Salads. Grains Whole grain breads, muffins, and cereals. Meats and other proteins Tough, fibrous meats. Highly seasoned meat such as corned beef, smoked meats, or fish. Processed high-fat meats such as brats, hot dogs, or sausage. Dairy Full-fat dairy foods such as ice cream and cheese. Beverages Caffeinated drinks. Alcohol. Seasonings and condiments Strongly flavored seasonings or condiments. Hot sauce. Salsa. Other foods Spicy foods. Fried or greasy foods. Sour foods, such as pickled or fermented foods like sauerkraut. Foods high in fiber. The items listed above may not be a complete list of foods and beverages you  should avoid. Contact a dietitian for more information. Summary A bland diet should be based on individual tolerance. It may consist of foods that are soft textured and do not have a lot of fat, fiber, acid, or seasonings. A bland diet may be recommended because avoiding certain foods, beverages, or spices may make you feel better. This information is not intended to replace advice given to you by your health care provider. Make sure you discuss any questions you have with your health care provider. Document Revised: 10/23/2021 Document Reviewed: 10/23/2021 Elsevier Patient Education  [place].   Please keep well-hydrated and get plenty of rest. Start a saline nasal rinse to flush out your nasal passages. You can use plain Mucinex to help thin congestion. If you have a humidifier, running in the bedroom at night. I want you to start OTC vitamin D3 1000 units daily, vitamin C 1000 mg daily, and a zinc supplement. Please take prescribed medications as directed.  You have been enrolled in a MyChart symptom monitoring program. Please answer these questions daily so we can keep track of how you are doing.  You were to quarantine for 5 days from onset of your symptoms.  After day 5, if you have had no fever and you are feeling better, you can end quarantine but need to mask for an additional 5 days. After day 5 if you have a fever or are having significant symptoms, please quarantine for  full 10 days.  If you note any worsening of symptoms, any significant shortness of breath or any chest pain, please seek ER evaluation ASAP.  Please do not delay care!  COVID-19: What to Do if You Are Sick If you test positive and are an older adult or someone who is at high risk of getting very sick from COVID-19, treatment may be available. Contact a healthcare provider right away after a positive test to determine if you are eligible, even if your symptoms are mild right now. You can also visit a Test  to Treat location and, if eligible, receive a prescription from a provider. Don't delay: Treatment must be started within the first few days to be effective. If you have a fever, cough, or other symptoms, you might have COVID-19. Most people have mild illness and are able to recover at home. If you are sick: Keep track of your symptoms. If you have an emergency warning sign (including trouble breathing), call 911. Steps to help prevent the spread of COVID-19 if you are sick If you are sick with COVID-19 or think you might have COVID-19, follow the steps below to care for yourself and to help protect other people in your home and community. Stay home except to get medical care Stay home. Most people with COVID-19 have mild illness and can recover at home without medical care. Do not leave your home, except to get medical care. Do not visit public areas and do not go to places where you are unable to wear a mask. Take care of yourself. Get rest and stay hydrated. Take over-the-counter medicines, such as acetaminophen, to help you feel better. Stay in touch with your doctor. Call before you get medical care. Be sure to get care if you have trouble breathing, or have any other emergency warning signs, or if you think it is an emergency. Avoid public transportation, ride-sharing, or taxis if possible. Get tested If you have symptoms of COVID-19, get tested. While waiting for test results, stay away from others, including staying apart from those living in your household. Get tested as soon as possible after your symptoms start. Treatments may be available for people with COVID-19 who are at risk for becoming very sick. Don't delay: Treatment must be started early to be effective--some treatments must begin within 5 days of your first symptoms. Contact your healthcare provider right away if your test result is positive to determine if you are eligible. Self-tests are one of several options for testing for  the virus that causes COVID-19 and may be more convenient than laboratory-based tests and point-of-care tests. Ask your healthcare provider or your local health department if you need help interpreting your test results. You can visit your state, tribal, local, and territorial health department's website to look for the latest local information on testing sites. Separate yourself from other people As much as possible, stay in a specific room and away from other people and pets in your home. If possible, you should use a separate bathroom. If you need to be around other people or animals in or outside of the home, wear a well-fitting mask. Tell your close contacts that they may have been exposed to COVID-19. An infected person can spread COVID-19 starting 48 hours (or 2 days) before the person has any symptoms or tests positive. By letting your close contacts know they may have been exposed to COVID-19, you are helping to protect everyone. See COVID-19 and Animals if you have questions about  pets. If you are diagnosed with COVID-19, someone from the health department may call you. Answer the call to slow the spread. Monitor your symptoms Symptoms of COVID-19 include fever, cough, or other symptoms. Follow care instructions from your healthcare provider and local health department. Your local health authorities may give instructions on checking your symptoms and reporting information. When to seek emergency medical attention Look for emergency warning signs* for COVID-19. If someone is showing any of these signs, seek emergency medical care immediately: Trouble breathing Persistent pain or pressure in the chest New confusion Inability to wake or stay awake Pale, gray, or blue-colored skin, lips, or nail beds, depending on skin tone *This list is not all possible symptoms. Please call your medical provider for any other symptoms that are severe or concerning to you. Call 911 or call ahead to your  local emergency facility: Notify the operator that you are seeking care for someone who has or may have COVID-19. Call ahead before visiting your doctor Call ahead. Many medical visits for routine care are being postponed or done by phone or telemedicine. If you have a medical appointment that cannot be postponed, call your doctor's office, and tell them you have or may have COVID-19. This will help the office protect themselves and other patients. If you are sick, wear a well-fitting mask You should wear a mask if you must be around other people or animals, including pets (even at home). Wear a mask with the best fit, protection, and comfort for you. You don't need to wear the mask if you are alone. If you can't put on a mask (because of trouble breathing, for example), cover your coughs and sneezes in some other way. Try to stay at least 6 feet away from other people. This will help protect the people around you. Masks should not be placed on young children under age 60 years, anyone who has trouble breathing, or anyone who is not able to remove the mask without help. Cover your coughs and sneezes Cover your mouth and nose with a tissue when you cough or sneeze. Throw away used tissues in a lined trash can. Immediately wash your hands with soap and water for at least 20 seconds. If soap and water are not available, clean your hands with an alcohol-based hand sanitizer that contains at least 60% alcohol. Clean your hands often Wash your hands often with soap and water for at least 20 seconds. This is especially important after blowing your nose, coughing, or sneezing; going to the bathroom; and before eating or preparing food. Use hand sanitizer if soap and water are not available. Use an alcohol-based hand sanitizer with at least 60% alcohol, covering all surfaces of your hands and rubbing them together until they feel dry. Soap and water are the best option, especially if hands are visibly  dirty. Avoid touching your eyes, nose, and mouth with unwashed hands. Handwashing Tips Avoid sharing personal household items Do not share dishes, drinking glasses, cups, eating utensils, towels, or bedding with other people in your home. Wash these items thoroughly after using them with soap and water or put in the dishwasher. Clean surfaces in your home regularly Clean and disinfect high-touch surfaces (for example, doorknobs, tables, handles, light switches, and countertops) in your "sick room" and bathroom. In shared spaces, you should clean and disinfect surfaces and items after each use by the person who is ill. If you are sick and cannot clean, a caregiver or other person should only clean  and disinfect the area around you (such as your bedroom and bathroom) on an as needed basis. Your caregiver/other person should wait as long as possible (at least several hours) and wear a mask before entering, cleaning, and disinfecting shared spaces that you use. Clean and disinfect areas that may have blood, stool, or body fluids on them. Use household cleaners and disinfectants. Clean visible dirty surfaces with household cleaners containing soap or detergent. Then, use a household disinfectant. Use a product from [employer] List N: Disinfectants for Coronavirus ([id number]). Be sure to follow the instructions on the label to ensure safe and effective use of the product. Many products recommend keeping the surface wet with a disinfectant for a certain period of time (look at "contact time" on the product label). You may also need to wear personal protective equipment, such as gloves, depending on the directions on the product label. Immediately after disinfecting, wash your hands with soap and water for 20 seconds. For completed guidance on cleaning and disinfecting your home, visit Complete Disinfection Guidance. Take steps to improve ventilation at home Improve ventilation (air flow) at home to help prevent  from spreading COVID-19 to other people in your household. Clear out COVID-19 virus particles in the air by opening windows, using air filters, and turning on fans in your home. Use this interactive tool to learn how to improve air flow in your home. When you can be around others after being sick with COVID-19 Deciding when you can be around others is different for different situations. Find out when you can safely end home isolation. For any additional questions about your care, contact your healthcare provider or state or local health department. 03/07/2021 Content source: [facility] for Immunization and Respiratory Diseases ([facility]), Division of Viral Diseases This information is not intended to replace advice given to you by your health care provider. Make sure you discuss any questions you have with your health care provider. Document Revised: 04/20/2021 Document Reviewed: 04/20/2021 Elsevier Patient Education  2022 [employer].      If you have been instructed to have an in-person evaluation today at a local Urgent Care facility, please use the link below. It will take you to a list of all of our available [facility], including address, phone number and hours of operation. Please do not delay care.  [facility]  If you or a family member do not have a primary care provider, use the link below to schedule a visit and establish care. When you choose a [facility] primary care physician or advanced practice provider, you gain a long-term partner in health. Find a Primary Care Provider  Learn more about [facility] in-office and virtual care options: [facility]

## 2022-12-11 ENCOUNTER — Telehealth: Payer: Commercial Managed Care - PPO | Admitting: Family Medicine

## 2022-12-11 DIAGNOSIS — B9689 Other specified bacterial agents as the cause of diseases classified elsewhere: Secondary | ICD-10-CM

## 2022-12-11 DIAGNOSIS — J019 Acute sinusitis, unspecified: Secondary | ICD-10-CM | POA: Diagnosis not present

## 2022-12-11 MED ORDER — AMOXICILLIN-POT CLAVULANATE 875-125 MG PO TABS: 1.0000 | ORAL_TABLET | Freq: Two times a day (BID) | ORAL | 0 refills | Status: AC

## 2022-12-11 NOTE — Progress Notes (Signed)

## 2023-04-21 ENCOUNTER — Telehealth: Payer: Commercial Managed Care - PPO | Admitting: Nurse Practitioner

## 2023-04-21 DIAGNOSIS — J020 Streptococcal pharyngitis: Secondary | ICD-10-CM | POA: Diagnosis not present

## 2023-04-21 MED ORDER — AMOXICILLIN 500 MG PO CAPS: 500.0000 mg | ORAL_CAPSULE | Freq: Two times a day (BID) | ORAL | 0 refills | Status: AC

## 2023-04-21 NOTE — Progress Notes (Signed)

## 2023-04-21 NOTE — Progress Notes (Signed)
I have spent 5 minutes in review of e-visit questionnaire, review and updating patient chart, medical decision making and response to patient.  ° °Keeghan Mcintire W Marvon [place]illing[place], NP ° °  °

## 2023-07-04 ENCOUNTER — Telehealth: Payer: Self-pay | Admitting: Physician Assistant

## 2023-07-04 DIAGNOSIS — J019 Acute sinusitis, unspecified: Secondary | ICD-10-CM

## 2023-07-04 DIAGNOSIS — B9689 Other specified bacterial agents as the cause of diseases classified elsewhere: Secondary | ICD-10-CM

## 2023-07-04 DIAGNOSIS — J069 Acute upper respiratory infection, unspecified: Secondary | ICD-10-CM

## 2023-07-04 MED ORDER — AMOXICILLIN-POT CLAVULANATE 875-125 MG PO TABS: 1.0000 | ORAL_TABLET | Freq: Two times a day (BID) | ORAL | 0 refills | Status: DC

## 2023-07-04 MED ORDER — FLUTICASONE PROPIONATE 50 MCG/ACT NA SUSP: 2.0000 | Freq: Every day | NASAL | 0 refills | Status: DC

## 2023-07-04 NOTE — Progress Notes (Signed)
Message sent to patient requesting further input regarding current symptoms. Awaiting patient response.  

## 2023-07-04 NOTE — Progress Notes (Signed)

## 2023-07-04 NOTE — Progress Notes (Signed)
I have spent 5 minutes in review of e-visit questionnaire, review and updating patient chart, medical decision making and response to patient.   William Cody Martin, PA-C    

## 2023-07-04 NOTE — Progress Notes (Signed)

## 2023-11-12 ENCOUNTER — Telehealth: Payer: Managed Care, Other (non HMO) | Admitting: Family Medicine

## 2023-11-12 DIAGNOSIS — J069 Acute upper respiratory infection, unspecified: Secondary | ICD-10-CM | POA: Diagnosis not present

## 2023-11-12 MED ORDER — BENZONATATE 100 MG PO CAPS: 100.0000 mg | ORAL_CAPSULE | Freq: Three times a day (TID) | ORAL | 0 refills | Status: DC | PRN

## 2023-11-12 MED ORDER — ALBUTEROL SULFATE HFA 108 (90 BASE) MCG/ACT IN AERS: 1.0000 | INHALATION_SPRAY | Freq: Four times a day (QID) | RESPIRATORY_TRACT | 0 refills | Status: DC | PRN

## 2023-11-12 MED ORDER — PSEUDOEPH-BROMPHEN-DM 30-2-10 MG/5ML PO SYRP: 5.0000 mL | ORAL_SOLUTION | Freq: Three times a day (TID) | ORAL | 0 refills | Status: DC | PRN

## 2023-11-12 NOTE — Progress Notes (Signed)
E-Visit for Cough   We are sorry that you are not feeling well.  Here is how we plan to help!  Based on your presentation I believe you most likely have A cough due to a virus.  This is called viral bronchitis and is best treated by rest, plenty of fluids and control of the cough.  You may use Ibuprofen or Tylenol as directed to help your symptoms.     In addition you may use A prescription cough medication called Tessalon Perles 100mg . You may take 1-2 capsules every 8 hours as needed for your cough.  I will order you a cough syrup to help as well as an inhaler for wheezing. I also recommend starting Mucinex (plain) to help with mucus that is stuck.    From your responses in the eVisit questionnaire you describe inflammation in the upper respiratory tract which is causing a significant cough.  This is commonly called Bronchitis and has four common causes:   Allergies Viral Infections Acid Reflux Bacterial Infection Allergies, viruses and acid reflux are treated by controlling symptoms or eliminating the cause. An example might be a cough caused by taking certain blood pressure medications. You stop the cough by changing the medication. Another example might be a cough caused by acid reflux. Controlling the reflux helps control the cough.  USE OF BRONCHODILATOR ("RESCUE") INHALERS: There is a risk from using your bronchodilator too frequently.  The risk is that over-reliance on a medication which only relaxes the muscles surrounding the breathing tubes can reduce the effectiveness of medications prescribed to reduce swelling and congestion of the tubes themselves.  Although you feel brief relief from the bronchodilator inhaler, your asthma may actually be worsening with the tubes becoming more swollen and filled with mucus.  This can delay other crucial treatments, such as oral steroid medications. If you need to use a bronchodilator inhaler daily, several times per day, you should discuss this  with your provider.  There are probably better treatments that could be used to keep your asthma under control.     HOME CARE Only take medications as instructed by your medical team. Complete the entire course of an antibiotic. Drink plenty of fluids and get plenty of rest. Avoid close contacts especially the very young and the elderly Cover your mouth if you cough or cough into your sleeve. Always remember to wash your hands A steam or ultrasonic humidifier can help congestion.   GET HELP RIGHT AWAY IF: You develop worsening fever. You become short of breath You cough up blood. Your symptoms persist after you have completed your treatment plan MAKE SURE YOU  Understand these instructions. Will watch your condition. Will get help right away if you are not doing well or get worse.    Thank you for choosing an e-visit.  Your e-visit answers were reviewed by a board certified advanced clinical practitioner to complete your personal care plan. Depending upon the condition, your plan could have included both over the counter or prescription medications.  Please review your pharmacy choice. Make sure the pharmacy is open so you can pick up prescription now. If there is a problem, you may contact your provider through [employer] and have the prescription routed to another pharmacy.  Your safety is important to [place]. If you have drug allergies check your prescription carefully.   For the next 24 hours you can use MyChart to ask questions about today's visit, request a non-urgent call back, or ask for a  work or school excuse. You will get an email in the next two days asking about your experience. I hope that your e-visit has been valuable and will speed your recovery.  I provided 5 minutes of non face-to-face time during this encounter for chart review, medication and order placement, as well as and documentation.

## 2023-11-18 ENCOUNTER — Telehealth: Payer: Managed Care, Other (non HMO)

## 2023-11-18 ENCOUNTER — Telehealth: Payer: Managed Care, Other (non HMO) | Admitting: Physician Assistant

## 2023-11-18 NOTE — Progress Notes (Signed)
The patient no-showed for appointment despite this provider sending direct link with no response and waiting for at least 10 minutes from appointment time for patient to join. They will be marked as a NS for this appointment/time.   Maryon Kemnitz M Kenzie Flakes, PA-C    

## 2023-11-21 ENCOUNTER — Telehealth: Payer: Managed Care, Other (non HMO) | Admitting: Physician Assistant

## 2023-11-21 DIAGNOSIS — B9689 Other specified bacterial agents as the cause of diseases classified elsewhere: Secondary | ICD-10-CM | POA: Diagnosis not present

## 2023-11-21 DIAGNOSIS — J208 Acute bronchitis due to other specified organisms: Secondary | ICD-10-CM

## 2023-11-21 MED ORDER — ALBUTEROL SULFATE HFA 108 (90 BASE) MCG/ACT IN AERS: 1.0000 | INHALATION_SPRAY | Freq: Four times a day (QID) | RESPIRATORY_TRACT | 0 refills | Status: DC | PRN

## 2023-11-21 MED ORDER — AZITHROMYCIN 250 MG PO TABS: ORAL_TABLET | ORAL | 0 refills | Status: AC

## 2023-11-21 MED ORDER — PREDNISONE 20 MG PO TABS: 40.0000 mg | ORAL_TABLET | Freq: Every day | ORAL | 0 refills | Status: DC

## 2023-11-21 MED ORDER — BENZONATATE 100 MG PO CAPS: 100.0000 mg | ORAL_CAPSULE | Freq: Three times a day (TID) | ORAL | 0 refills | Status: DC | PRN

## 2023-11-21 NOTE — Progress Notes (Signed)
E-Visit for Cough   We are sorry that you are not feeling well.  Here is how we plan to help!  Based on your presentation I believe you most likely have A cough due to bacteria.  When patients have a fever and a productive cough with a change in color or increased sputum production, we are concerned about bacterial bronchitis.  If left untreated it can progress to pneumonia.  If your symptoms do not improve with your treatment plan it is important that you contact your provider.   I have prescribed Azithromyin 250 mg: two tablets now and then one tablet daily for 4 additonal days    In addition you may use A non-prescription cough medication called Mucinex DM: take 2 tablets every 12 hours. and A prescription cough medication called Tessalon Perles 100mg . You may take 1-2 capsules every 8 hours as needed for your cough.  I have also prescribed Prednisone 20mg  Take 2 tablets (40mg ) daily for 5 days  AND  Albuterol inhaler Use 1-2 puffs every 6 hours as needed for shortness of breath, chest tightness, and/or wheezing.   From your responses in the eVisit questionnaire you describe inflammation in the upper respiratory tract which is causing a significant cough.  This is commonly called Bronchitis and has four common causes:   Allergies Viral Infections Acid Reflux Bacterial Infection Allergies, viruses and acid reflux are treated by controlling symptoms or eliminating the cause. An example might be a cough caused by taking certain blood pressure medications. You stop the cough by changing the medication. Another example might be a cough caused by acid reflux. Controlling the reflux helps control the cough.  USE OF BRONCHODILATOR ("RESCUE") INHALERS: There is a risk from using your bronchodilator too frequently.  The risk is that over-reliance on a medication which only relaxes the muscles surrounding the breathing tubes can reduce the effectiveness of medications prescribed to reduce swelling  and congestion of the tubes themselves.  Although you feel brief relief from the bronchodilator inhaler, your asthma may actually be worsening with the tubes becoming more swollen and filled with mucus.  This can delay other crucial treatments, such as oral steroid medications. If you need to use a bronchodilator inhaler daily, several times per day, you should discuss this with your provider.  There are probably better treatments that could be used to keep your asthma under control.     HOME CARE Only take medications as instructed by your medical team. Complete the entire course of an antibiotic. Drink plenty of fluids and get plenty of rest. Avoid close contacts especially the very young and the elderly Cover your mouth if you cough or cough into your sleeve. Always remember to wash your hands A steam or ultrasonic humidifier can help congestion.   GET HELP RIGHT AWAY IF: You develop worsening fever. You become short of breath You cough up blood. Your symptoms persist after you have completed your treatment plan MAKE SURE YOU  Understand these instructions. Will watch your condition. Will get help right away if you are not doing well or get worse.    Thank you for choosing an e-visit.  Your e-visit answers were reviewed by a board certified advanced clinical practitioner to complete your personal care plan. Depending upon the condition, your plan could have included both over the counter or prescription medications.  Please review your pharmacy choice. Make sure the pharmacy is open so you can pick up prescription now. If there is a problem, you  may contact your provider through [employer] and have the prescription routed to another pharmacy.  Your safety is important to [place]. If you have drug allergies check your prescription carefully.   For the next 24 hours you can use MyChart to ask questions about today's visit, request a non-urgent call back, or ask for a work or school  excuse. You will get an email in the next two days asking about your experience. I hope that your e-visit has been valuable and will speed your recovery.   I have spent 5 minutes in review of e-visit questionnaire, review and updating patient chart, medical decision making and response to patient.   Margaretann Loveless, PA-C

## 2024-01-17 ENCOUNTER — Telehealth: Payer: Managed Care, Other (non HMO) | Admitting: Physician Assistant

## 2024-01-17 DIAGNOSIS — R062 Wheezing: Secondary | ICD-10-CM | POA: Diagnosis not present

## 2024-01-17 DIAGNOSIS — R0982 Postnasal drip: Secondary | ICD-10-CM | POA: Diagnosis not present

## 2024-01-17 MED ORDER — ALBUTEROL SULFATE HFA 108 (90 BASE) MCG/ACT IN AERS: 1.0000 | INHALATION_SPRAY | Freq: Four times a day (QID) | RESPIRATORY_TRACT | 0 refills | Status: DC | PRN

## 2024-01-17 MED ORDER — IPRATROPIUM BROMIDE 0.03 % NA SOLN: 2.0000 | Freq: Two times a day (BID) | NASAL | 0 refills | Status: DC

## 2024-01-17 NOTE — Progress Notes (Signed)
E-Visit for Asthma  Based on what you have shared with me, it looks like you may have a flare up of your asthma.  Asthma is a chronic (ongoing) lung disease which results in airway obstruction, inflammation and hyper-responsiveness.   Asthma symptoms vary from person to person, with common symptoms including nighttime awakening and decreased ability to participate in normal activities as a result of shortness of breath. It is often triggered by changes in weather, changes in the season, changes in air temperature, or inside (home, school, daycare or work) allergens such as animal dander, mold, mildew, woodstoves or cockroaches.   It can also be triggered by hormonal changes, extreme emotion, physical exertion or an upper respiratory tract illness.     It is important to identify the trigger, and then eliminate or avoid the trigger if possible.   If you have been prescribed medications to be taken on a regular basis, it is important to follow the asthma action plan and to follow guidelines to adjust medication in response to increasing symptoms of decreased peak expiratory flow rate  Treatment: I have prescribed: Albuterol (Proventil HFA; Ventolin HFA) 108 (90 Base) MCG/ACT Inhaler 2 puffs into the lungs every six hours as needed for wheezing or shortness of breath  I have prescribed Ipratropium bromide nasal spray Use 2 sprays in each nostril for drainage.   HOME CARE Only take medications as instructed by your medical team. Consider wearing a mask or scarf to improve breathing air temperature have been shown to decrease irritation and decrease exacerbations Get rest. Taking a steamy shower or using a humidifier may help nasal congestion sand ease sore throat pain. You can place a towel over your head and breathe in the steam from hot water coming from a faucet. Using a saline nasal  spray works much the same way.  Cough drops, hare candies and sore throat lozenges may ease your cough.  Avoid close contacts especially the very you and the elderly Cover your mouth if you cough or sneeze Always remember to wash your hands.    GET HELP RIGHT AWAY IF: You develop worsening symptoms; breathlessness at rest, drowsy, confused or agitated, unable to speak in full sentences You have coughing fits You develop a severe headache or visual changes You develop shortness of breath, difficulty breathing or start having chest pain Your symptoms persist after you have completed your treatment plan If your symptoms do not improve within 10 days  MAKE SURE YOU Understand these instructions. Will watch your condition. Will get help right away if you are not doing well or get worse.   Your e-visit answers were reviewed by a board certified advanced clinical practitioner to complete your personal care plan, Depending upon the condition, your plan could have included both over the counter or prescription medications.   Please review your pharmacy choice. Your safety is important to [place]. If you have drug allergies check your prescription carefully.  You can use MyChart to ask questions about today's visit, request a non-urgent  call back, or ask for a work or school excuse for 24 hours related to this e-Visit. If it has been greater than 24 hours you will need to follow up with your provider, or enter a new e-Visit to address those concerns.   You will get an e-mail in the next two days asking about your experience. I hope that your e-visit has been valuable and will speed your recovery. Thank you for using e-visits.   I have  spent 5 minutes in review of e-visit questionnaire, review and updating patient chart, medical decision making and response to patient.   Margaretann Loveless, PA-C

## 2024-02-21 ENCOUNTER — Telehealth: Admitting: Physician Assistant

## 2024-02-21 DIAGNOSIS — J4531 Mild persistent asthma with (acute) exacerbation: Secondary | ICD-10-CM

## 2024-02-21 MED ORDER — PREDNISONE 20 MG PO TABS: 40.0000 mg | ORAL_TABLET | Freq: Every day | ORAL | 0 refills | Status: DC

## 2024-02-21 MED ORDER — BENZONATATE 100 MG PO CAPS: 100.0000 mg | ORAL_CAPSULE | Freq: Three times a day (TID) | ORAL | 0 refills | Status: DC | PRN

## 2024-02-21 NOTE — Progress Notes (Signed)
E-Visit for Cough  We are sorry that you are not feeling well.  Here is how we plan to help!  Based on your presentation I believe you most likely have A cough due to a virus.  This is called viral bronchitis and is best treated by rest, plenty of fluids and control of the cough.  You may use Ibuprofen or Tylenol as directed to help your symptoms.     In addition you may use A non-prescription cough medication called Mucinex DM: take 2 tablets every 12 hours. and A prescription cough medication called Tessalon Perles 100mg . You may take 1-2 capsules every 8 hours as needed for your cough.  Prednisone 20mg  Take 2 tablets (40mg ) daily for 5 days  From your responses in the eVisit questionnaire you describe inflammation in the upper respiratory tract which is causing a significant cough.  This is commonly called Bronchitis and has four common causes:   Allergies Viral Infections Acid Reflux Bacterial Infection Allergies, viruses and acid reflux are treated by controlling symptoms or eliminating the cause. An example might be a cough caused by taking certain blood pressure medications. You stop the cough by changing the medication. Another example might be a cough caused by acid reflux. Controlling the reflux helps control the cough.  USE OF BRONCHODILATOR ("RESCUE") INHALERS: There is a risk from using your bronchodilator too frequently.  The risk is that over-reliance on a medication which only relaxes the muscles surrounding the breathing tubes can reduce the effectiveness of medications prescribed to reduce swelling and congestion of the tubes themselves.  Although you feel brief relief from the bronchodilator inhaler, your asthma may actually be worsening with the tubes becoming more swollen and filled with mucus.  This can delay other crucial treatments, such as oral steroid medications. If you need to use a bronchodilator inhaler daily, several times per day, you should discuss this with your  provider.  There are probably better treatments that could be used to keep your asthma under control.     HOME CARE Only take medications as instructed by your medical team. Complete the entire course of an antibiotic. Drink plenty of fluids and get plenty of rest. Avoid close contacts especially the very young and the elderly Cover your mouth if you cough or cough into your sleeve. Always remember to wash your hands A steam or ultrasonic humidifier can help congestion.   GET HELP RIGHT AWAY IF: You develop worsening fever. You become short of breath You cough up blood. Your symptoms persist after you have completed your treatment plan MAKE SURE YOU  Understand these instructions. Will watch your condition. Will get help right away if you are not doing well or get worse.    Thank you for choosing an e-visit.  Your e-visit answers were reviewed by a board certified advanced clinical practitioner to complete your personal care plan. Depending upon the condition, your plan could have included both over the counter or prescription medications.  Please review your pharmacy choice. Make sure the pharmacy is open so you can pick up prescription now. If there is a problem, you may contact your provider through [employer] and have the prescription routed to another pharmacy.  Your safety is important to [place]. If you have drug allergies check your prescription carefully.   For the next 24 hours you can use MyChart to ask questions about today's visit, request a non-urgent call back, or ask for a work or school excuse. You will get an  email in the next two days asking about your experience. I hope that your e-visit has been valuable and will speed your recovery.  I have spent 5 minutes in review of e-visit questionnaire, review and updating patient chart, medical decision making and response to patient.   Margaretann Loveless, PA-C

## 2024-08-31 ENCOUNTER — Telehealth: Admitting: Physician Assistant

## 2024-08-31 DIAGNOSIS — J208 Acute bronchitis due to other specified organisms: Secondary | ICD-10-CM

## 2024-08-31 MED ORDER — ALBUTEROL SULFATE HFA 108 (90 BASE) MCG/ACT IN AERS: 1.0000 | INHALATION_SPRAY | Freq: Four times a day (QID) | RESPIRATORY_TRACT | 0 refills | Status: AC | PRN

## 2024-08-31 NOTE — Progress Notes (Signed)
 E-Visit for Cough   We are sorry that you are not feeling well.  Here is how we plan to help!  Based on your presentation I believe you most likely have A cough due to a virus.  This is called viral bronchitis and is best treated by rest, plenty of fluids and control of the cough.  You may use Ibuprofen or Tylenol  as directed to help your symptoms.     In addition you may use A non-prescription cough medication called Mucinex DM: take 2 tablets every 12 hours.  I have prescribed: Albuterol  inhaler Use 1-2 puffs every 6 hours as needed for shortness of breath, chest tightness, and/or wheezing.  From your responses in the eVisit questionnaire you describe inflammation in the upper respiratory tract which is causing a significant cough.  This is commonly called Bronchitis and has four common causes:   Allergies Viral Infections Acid Reflux Bacterial Infection Allergies, viruses and acid reflux are treated by controlling symptoms or eliminating the cause. An example might be a cough caused by taking certain blood pressure medications. You stop the cough by changing the medication. Another example might be a cough caused by acid reflux. Controlling the reflux helps control the cough.  USE OF BRONCHODILATOR (RESCUE) INHALERS: There is a risk from using your bronchodilator too frequently.  The risk is that over-reliance on a medication which only relaxes the muscles surrounding the breathing tubes can reduce the effectiveness of medications prescribed to reduce swelling and congestion of the tubes themselves.  Although you feel brief relief from the bronchodilator inhaler, your asthma may actually be worsening with the tubes becoming more swollen and filled with mucus.  This can delay other crucial treatments, such as oral steroid medications. If you need to use a bronchodilator inhaler daily, several times per day, you should discuss this with your provider.  There are probably better treatments that  could be used to keep your asthma under control.     HOME CARE Only take medications as instructed by your medical team. Complete the entire course of an antibiotic. Drink plenty of fluids and get plenty of rest. Avoid close contacts especially the very young and the elderly Cover your mouth if you cough or cough into your sleeve. Always remember to wash your hands A steam or ultrasonic humidifier can help congestion.   GET HELP RIGHT AWAY IF: You develop worsening fever. You become short of breath You cough up blood. Your symptoms persist after you have completed your treatment plan MAKE SURE YOU  Understand these instructions. Will watch your condition. Will get help right away if you are not doing well or get worse.    Thank you for choosing an e-visit.  Your e-visit answers were reviewed by a board certified advanced clinical practitioner to complete your personal care plan. Depending upon the condition, your plan could have included both over the counter or prescription medications.  Please review your pharmacy choice. Make sure the pharmacy is open so you can pick up prescription now. If there is a problem, you may contact your provider through [employer] and have the prescription routed to another pharmacy.  Your safety is important to us . If you have drug allergies check your prescription carefully.   For the next 24 hours you can use MyChart to ask questions about today's visit, request a non-urgent call back, or ask for a work or school excuse. You will get an email in the next two days asking about your experience.  I hope that your e-visit has been valuable and will speed your recovery.     I have spent 5 minutes in review of e-visit questionnaire, review and updating patient chart, medical decision making and response to patient.   Delon CHRISTELLA Dickinson, PA-C

## 2024-09-28 ENCOUNTER — Telehealth: Admitting: Family Medicine

## 2024-09-28 DIAGNOSIS — R058 Other specified cough: Secondary | ICD-10-CM

## 2024-09-28 NOTE — Progress Notes (Signed)
 Angelica Myers   It looks like you had a similar cough last month. We need you to be seen in person this morning to have your lungs assessed for possible Pneumonia. We feel your condition warrants further evaluation and  recommend that you be seen in a face-to-face visit.   NOTE: There will be NO CHARGE for this E-Visit   If you are having a true medical emergency, please call 911.

## 2024-12-29 ENCOUNTER — Telehealth: Admitting: Family Medicine

## 2024-12-29 DIAGNOSIS — J019 Acute sinusitis, unspecified: Secondary | ICD-10-CM

## 2024-12-29 DIAGNOSIS — B9789 Other viral agents as the cause of diseases classified elsewhere: Secondary | ICD-10-CM

## 2024-12-29 MED ORDER — FLUTICASONE PROPIONATE 50 MCG/ACT NA SUSP: 2.0000 | Freq: Every day | NASAL | 6 refills | Status: AC

## 2024-12-29 NOTE — Progress Notes (Signed)
 We are sorry that you are not feeling well.  Here is how we plan to help!  Based on what you have shared with me it looks like you have sinusitis.  Sinusitis is inflammation and infection in the sinus cavities of the head.  Based on your presentation I believe you most likely have Acute Viral Sinusitis.This is an infection most likely caused by a virus. There is not specific treatment for viral sinusitis other than to help you with the symptoms until the infection runs its course.  You may use an oral decongestant such as Mucinex D or if you have glaucoma or high blood pressure use plain Mucinex. Saline nasal spray help and can safely be used as often as needed for congestion, I have prescribed: Fluticasone  nasal spray two sprays in each nostril once a day  You can purchase sudafed otc.   Some authorities believe that zinc sprays or the use of Echinacea may shorten the course of your symptoms.  Sinus infections are not as easily transmitted as other respiratory infection, however we still recommend that you avoid close contact with loved ones, especially the very young and elderly.  Remember to wash your hands thoroughly throughout the day as this is the number one way to prevent the spread of infection!  Home Care: Only take medications as instructed by your medical team. Do not take these medications with alcohol. A steam or ultrasonic humidifier can help congestion.  You can place a towel over your head and breathe in the steam from hot water coming from a faucet. Avoid close contacts especially the very young and the elderly. Cover your mouth when you cough or sneeze. Always remember to wash your hands.  Get Help Right Away If: You develop worsening fever or sinus pain. You develop a severe head ache or visual changes. Your symptoms persist after you have completed your treatment plan.  Make sure you Understand these instructions. Will watch your condition. Will get help right away if  you are not doing well or get worse.  Your e-visit answers were reviewed by a board certified advanced clinical practitioner to complete your personal care plan.  Depending on the condition, your plan could have included both over the counter or prescription medications.  If there is a problem please reply  once you have received a response from your provider.  Your safety is important to us .  If you have drug allergies check your prescription carefully.    You can use MyChart to ask questions about todays visit, request a non-urgent call back, or ask for a work or school excuse for 24 hours related to this e-Visit. If it has been greater than 24 hours you will need to follow up with your provider, or enter a new e-Visit to address those concerns.  You will get an e-mail in the next two days asking about your experience.  I hope that your e-visit has been valuable and will speed your recovery. Thank you for using e-visits.  I have spent 5 minutes in review of e-visit questionnaire, review and updating patient chart, medical decision making and response to patient.   Salahuddin Arismendez, FNP
